# Patient Record
Sex: Male | Born: 1945 | Race: White | Hispanic: No | Marital: Married | Smoking: Current some day smoker
Health system: Southern US, Community
[De-identification: ages and names within clinical notes are randomized; demographics above are authoritative.]

## PROBLEM LIST (undated history)

## (undated) DIAGNOSIS — G4733 Obstructive sleep apnea (adult) (pediatric): Secondary | ICD-10-CM

## (undated) DIAGNOSIS — G20 Parkinson's disease: Secondary | ICD-10-CM

## (undated) DIAGNOSIS — Z7409 Other reduced mobility: Secondary | ICD-10-CM

## (undated) DIAGNOSIS — E785 Hyperlipidemia, unspecified: Secondary | ICD-10-CM

## (undated) DIAGNOSIS — R1310 Dysphagia, unspecified: Secondary | ICD-10-CM

## (undated) DIAGNOSIS — G903 Multi-system degeneration of the autonomic nervous system: Secondary | ICD-10-CM

## (undated) DIAGNOSIS — Z789 Other specified health status: Secondary | ICD-10-CM

## (undated) HISTORY — DX: Multi-system degeneration of the autonomic nervous system: G90.3

## (undated) HISTORY — DX: Dysphagia, unspecified: R13.10

## (undated) HISTORY — PX: APPENDECTOMY: SHX54

## (undated) HISTORY — PX: TRANSURETHRAL RESECTION OF PROSTATE: SHX73

## (undated) HISTORY — PX: CATARACT EXTRACTION: SUR2

## (undated) HISTORY — DX: Other specified health status: Z78.9

## (undated) HISTORY — DX: Hyperlipidemia, unspecified: E78.5

## (undated) HISTORY — PX: TONSILLECTOMY: SUR1361

## (undated) HISTORY — DX: Other reduced mobility: Z74.09

## (undated) HISTORY — PX: BELPHAROPTOSIS REPAIR: SHX369

---
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
## 2009-10-23 DIAGNOSIS — H526 Other disorders of refraction: Secondary | ICD-10-CM | POA: Insufficient documentation

## 2013-11-08 DIAGNOSIS — H5111 Convergence insufficiency: Secondary | ICD-10-CM | POA: Insufficient documentation

## 2016-02-15 DIAGNOSIS — I10 Essential (primary) hypertension: Secondary | ICD-10-CM

## 2016-02-15 DIAGNOSIS — N138 Other obstructive and reflux uropathy: Secondary | ICD-10-CM

## 2016-02-15 DIAGNOSIS — N401 Enlarged prostate with lower urinary tract symptoms: Secondary | ICD-10-CM

## 2016-02-15 DIAGNOSIS — K5900 Constipation, unspecified: Secondary | ICD-10-CM | POA: Insufficient documentation

## 2016-02-15 DIAGNOSIS — N529 Male erectile dysfunction, unspecified: Secondary | ICD-10-CM | POA: Insufficient documentation

## 2016-02-15 DIAGNOSIS — E785 Hyperlipidemia, unspecified: Secondary | ICD-10-CM | POA: Insufficient documentation

## 2016-02-15 HISTORY — DX: Other obstructive and reflux uropathy: N13.8

## 2016-02-15 HISTORY — DX: Essential (primary) hypertension: I10

## 2016-02-15 HISTORY — DX: Other obstructive and reflux uropathy: N40.1

## 2016-09-01 DIAGNOSIS — G4733 Obstructive sleep apnea (adult) (pediatric): Secondary | ICD-10-CM | POA: Insufficient documentation

## 2016-09-01 HISTORY — DX: Obstructive sleep apnea (adult) (pediatric): G47.33

## 2017-06-16 DIAGNOSIS — G3183 Dementia with Lewy bodies: Secondary | ICD-10-CM

## 2017-06-16 DIAGNOSIS — G20 Parkinson's disease: Secondary | ICD-10-CM

## 2017-06-16 DIAGNOSIS — F0280 Dementia in other diseases classified elsewhere without behavioral disturbance: Secondary | ICD-10-CM | POA: Insufficient documentation

## 2017-06-16 HISTORY — DX: Dementia in other diseases classified elsewhere, unspecified severity, without behavioral disturbance, psychotic disturbance, mood disturbance, and anxiety: F02.80

## 2017-06-16 HISTORY — DX: Parkinson's disease: G20

## 2017-06-16 HISTORY — DX: Dementia with Lewy bodies: G31.83

## 2017-09-07 DIAGNOSIS — G2581 Restless legs syndrome: Secondary | ICD-10-CM | POA: Insufficient documentation

## 2017-09-07 HISTORY — DX: Restless legs syndrome: G25.81

## 2017-12-28 DIAGNOSIS — R252 Cramp and spasm: Secondary | ICD-10-CM | POA: Insufficient documentation

## 2019-07-26 ENCOUNTER — Other Ambulatory Visit: Payer: Self-pay

## 2019-07-26 ENCOUNTER — Encounter: Payer: Self-pay | Admitting: Neurology

## 2019-07-26 ENCOUNTER — Ambulatory Visit: Payer: Medicare Other | Admitting: Neurology

## 2019-07-26 VITALS — BP 116/72 | HR 94 | Temp 97.9°F | Ht 67.0 in | Wt 185.3 lb

## 2019-07-26 DIAGNOSIS — R413 Other amnesia: Secondary | ICD-10-CM | POA: Diagnosis not present

## 2019-07-26 DIAGNOSIS — G20 Parkinson's disease: Secondary | ICD-10-CM

## 2019-07-26 DIAGNOSIS — Z9181 History of falling: Secondary | ICD-10-CM

## 2019-07-26 DIAGNOSIS — K5909 Other constipation: Secondary | ICD-10-CM

## 2019-07-26 NOTE — Patient Instructions (Addendum)
You have a history of Parkinson's disease with right-sided more than left-sided symptoms and signs.  I agree with Dr. Clarisa Kindred assessment and plan for you.  I would recommend that you continue with your Sinemet IR and Sinemet CR at the scheduled times, please try to be on schedule and try not to skip any doses.I would not favor adding another medication such as the amantadine at this time.  I recommend that you continue with the entacapone as scheduled 4 times a day.  Please be more proactive about your constipation issues, I would like for you to take MiraLAX as needed and add natural fiber such as Metamucil if possible or more natural fiber and probiotics, try to hydrate well with water, 6 to 8 cups/day are recommended, 8 ounce each. I do believe you should start using a rolling walker.  Please also follow through with the physical therapy referral that Dr. Anne Hahn initiated, I believe you will benefit from physical therapy. Please follow-up with her as scheduled. Please be cautious with your alcohol intake, try to limit yourself to no more than 1 drink occasionally. As discussed, I am not sure that you are safe to drive, please also discuss with Dr. Anne Hahn at your next appointment.

## 2019-07-26 NOTE — Progress Notes (Signed)
Subjective:    Patient ID: Ryan Morales is a 74 y.o. male.  HPI    Ryan Foley, MD, PhD [facility] [address], [address] [address] [place], [place] [address]  Dear Dr. Anne Hahn,   I saw your patient, Ryan Morales, upon your kind request to my neurologic clinic today for second opinion of his parkinsonism.  The patient is accompanied by his wife today.  As you know, Ryan Morales is a 73 yo RH gentleman with an underlying medical history of hyperlipidemia, diabetes, obstructive sleep apnea, and obesity, who was diagnosed with Parkinson's disease in or around 2015.  He started off with right-sided symptoms, particularly a tremor.  He has had fine motor difficulties and balance difficulties over time, had vision difficulties and was seen by a neuro-ophthalmologist in [place] where they moved from in December 2020.  He used to see a movement disorder specialist in [place], [place].  He was started on Sinemet IR.  He has not been on a dopamine agonist as I understand.  Sinemet CR and Comtan were added afterwards and amantadine was considered but never actually added.  He has had constipation, lately, he has had more sinister constipation, takes MiraLAX only as needed but typically has a bowel movement every 4 days.  He tries to hydrate well.  Before they moved here he had a reasonably good schedule with his medication and also had established with physical therapy.  He was more active.  He has had more stress because of the move, they are still settling in and unpacking.  They moved into a single story home to be closer to his stepdaughter.  His wife has 1 daughter and 1 son, son is close to [place] and daughter works at [employer]. I reviewed your office note from 06/22/2019.  He had a recent brain MRI through your office as well as an EEG.  I reviewed the results: Brain MRI with and without contrast on 07/12/2019 showed: IMPRESSION:   1. Moderate cerebral atrophy and  mild-to-moderate leukoaraiosis.  2. Small focal area of macrocystic encephalomalacia in the posterior, superior portion of the left superior frontal gyrus.  3. 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  4. No evidence of acute intracranial abnormality. He had an EEG on 06/28/2019 and I reviewed the results:    INTERPRETATION:        -Drowsiness and sleep were  achieved.   -No clear epileptiform activity was noted and therefore no definite evidence was noted for a seizure disorder.     -The mild diffuse slowing indicates a  slight generalized neurophysiological disturbance.  The diffuse slowing can be seen in patients with toxic or metabolic disorders, neurodegenerative disorders, or recent seizure.       He is currently on Sinemet IR 1 pill 4 times a day at 7, 1, 4 PM and 7 PM, he takes Sinemet ER twice daily at 10 AM and bedtime.  He takes the entacapone 200 mg 4 times a day with the Sinemet IR.  Sometimes when he forgets a dose he does not really feel a difference.   He has had memory loss, he is currently on Exelon patch, 9.5 mg daily. He has not driven in about a year.  His wife is not fully comfortable with him driving any longer and getting a [employer].  He reports that he volitionally gave up driving as he was not as comfortable.  He would be okay no longer to  drive.  He does drink alcohol on a regular basis, maybe 5 days out of the week, up to 2 drinks each time. He has been referred to physical therapy but has not heard back yet.  He was also advised to start using a walker, he has not gotten a walker yet.  He has fallen.  He was encouraged to use a walker at all times.  His Past Medical History Is Significant For: Past Medical History:  Diagnosis Date  . OSA (obstructive sleep apnea)   . Parkinson disease (HCC)     His Past Surgical History Is Significant For: Past Surgical History:  Procedure Laterality Date  . APPENDECTOMY    . BELPHAROPTOSIS  REPAIR Bilateral   . CATARACT EXTRACTION Bilateral   . TONSILLECTOMY    . TRANSURETHRAL RESECTION OF PROSTATE      His Family History Is Significant For: Family History  Problem Relation Age of Onset  . Diabetes Mother   . Congestive Heart Failure Mother   . High Cholesterol Father   . Prostate cancer Father   . Stroke Father     His Social History Is Significant For: Social History   Socioeconomic History  . Marital status: Married    Spouse name: Not on file  . Number of children: Not on file  . Years of education: Not on file  . Highest education level: Not on file  Occupational History  . Not on file  Tobacco Use  . Smoking status: Current Some Day Smoker    Types: Cigars  . Smokeless tobacco: Never Used  Substance and Sexual Activity  . Alcohol use: Yes    Comment: 6-10 drinks per week brandy, scotch   . Drug use: Never  . Sexual activity: Not on file  Other Topics Concern  . Not on file  Social History Narrative  . Not on file   Social Determinants of Health   Financial Resource Strain:   . Difficulty of Paying Living Expenses: Not on file  Food Insecurity:   . Worried About Programme researcher, broadcasting/film/video in the Last Year: Not on file  . Ran Out of Food in the Last Year: Not on file  Transportation Needs:   . Lack of Transportation (Medical): Not on file  . Lack of Transportation (Non-Medical): Not on file  Physical Activity:   . Days of Exercise per Week: Not on file  . Minutes of Exercise per Session: Not on file  Stress:   . Feeling of Stress : Not on file  Social Connections:   . Frequency of Communication with Friends and Family: Not on file  . Frequency of Social Gatherings with Friends and Family: Not on file  . Attends Religious Services: Not on file  . Active Member of Clubs or Organizations: Not on file  . Attends Banker Meetings: Not on file  . Marital Status: Not on file    His Allergies Are:  Not on File:   Her Current  Medications Are:  Outpatient Encounter Medications as of 07/26/2019  Medication Sig  . atorvastatin (LIPITOR) 40 MG tablet Take 40 mg by mouth daily.  Marland Kitchen BABY ASPIRIN PO Take by mouth.  . carbidopa-levodopa (SINEMET IR) 25-100 MG tablet Take 1 tablet by mouth 4 (four) times daily.  . Carbidopa-Levodopa ER (SINEMET CR) 25-100 MG tablet controlled release Take 1 tablet by mouth 2 (two) times daily.  . entacapone (COMTAN) 200 MG tablet Take 200 mg by mouth 4 (four) times daily.  Marland Kitchen  Famotidine 20 MG CHEW Chew by mouth. BID PRN  . finasteride (PROSCAR) 5 MG tablet Take 5 mg by mouth daily.  Marland Kitchen gabapentin (NEURONTIN) 300 MG capsule Take 300 mg by mouth. 1 in the am 2 in the evening  . ketoconazole (NIZORAL) 2 % shampoo Apply 1 application topically 2 (two) times a week.  Marland Kitchen lisinopril (ZESTRIL) 5 MG tablet Take 5 mg by mouth daily.  Marland Kitchen MELATONIN PO Take by mouth.  . metFORMIN (GLUCOPHAGE) 1000 MG tablet Take 1,000 mg by mouth 2 (two) times daily with a meal.  . mometasone (ELOCON) 0.1 % cream Apply 1 application topically daily.  . rivastigmine (EXELON) 9.5 mg/24hr Place 9.5 mg onto the skin daily.   No facility-administered encounter medications on file as of 07/26/2019.  :   Review of Systems:  Out of a complete 14 point review of systems, all are reviewed and negative with the exception of these symptoms as listed below:  Review of Systems  Neurological:       Reports he is here for evaluation on parkinson's.     Objective:  Neurological Exam  Physical Exam Physical Examination:   Vitals:   07/26/19 1615  BP: 116/72  Pulse: 94  Temp: 97.9 F (36.6 C)    General Examination: The patient is a very pleasant 74 y.o. male in no acute distress. He appears well-developed and well-nourished and well groomed.   HEENT: Normocephalic, atraumatic, pupils are equal, round and reactive to light and accommodation. He wears corrective eyeglasses, extraocular tracking is mildly Impaired.  Hearing is  grossly intact, face is symmetric with moderate facial masking, he has moderate nuchal rigidity, no obvious lip, neck or jaw tremor.  Speech is mildly hypophonic, no obvious dysarthria noted.  He has no orofacial dyskinesias, airway examination reveals mild mouth dryness, no obvious sialorrhea, tongue protrudes centrally and palate elevates symmetrically.  Chest: Clear to auscultation without wheezing, rhonchi or crackles noted.  Heart: S1+S2+0, regular and normal without murmurs, rubs or gallops noted.   Abdomen: Soft, non-tender and non-distended with normal bowel sounds appreciated on auscultation.  Extremities: There is no pitting edema in the distal lower extremities bilaterally.   Skin: Warm and dry without trophic changes noted.  Musculoskeletal: exam reveals no obvious joint deformities, tenderness or joint swelling or erythema.   Neurologically:  Mental status: The patient is awake, alert and oriented in all 4 spheres. His immediate and remote memory, attention, language skills and fund of knowledge are Mildly impaired, he has difficulty with details and timelines in his history, his wife adds more details to the history. Mood is normal and affect is normal.  Cranial nerves II - XII are as described above under HEENT exam. In addition: shoulder shrug is normal with equal But left shoulder is slightly higher than right.  Motor examination reveals normal bulk and strength for age, he does have an increased tone throughout, right side with some cogwheeling noted.  He has no obvious resting tremor.  Romberg is not tested for safety, reflexes are about 1+ throughout, absent in the ankles.  Fine motor skills and coordination: He has moderate difficulty with finger taps, hand movements and rapid alternating patting in the right upper extremity, slightly better on the left.  Foot taps and foot agility mildly impaired bilaterally.  No obvious lateralization noted.  He stands with difficulty and has  to push himself up, he has a moderately stooped posture. He did not bring a cane or walker, he has a  decreased stride length and decreased pace, decreased arm swing noted on the right more than left, he turns in 3-4 steps, slight insecurity with turns are noted.Balance is mildly impaired, he denies any orthostatic lightheadedness. Cerebellar testing shows no dysmetria or intention tremor, sensory exam is intact to light touch throughout.  Assessment and Plan:  In summary, Ryan Morales is a very pleasant 74 y.o.-year old male with an underlying medical history of hyperlipidemia, diabetes, obstructive sleep apnea, and obesity, who Presents for second opinion of his Parkinson's disease which has been treated with levodopa therapy and entacapone.  He has had associated constipation, balance problems and falls, and associated memory loss.His history and examination are in keeping with right-sided predominant Parkinson's disease.  He does not have a telltale tremor currently.  I had a long discussion with the patient and his wife.  He has had some flareup in his stress and anxiety what with their recent move from [place].He is encouraged to stay better hydrated with water, and be more proactive and consistent with his constipation regimen.  He is furthermore cautioned regarding his regular alcohol intake and advised to limit himself to no more than 1 drink on occasion.  We talked about his driving.  I discouraged him from driving at this time.  He is advised to start using a walker as previously advised.  He is waiting to hear back from physical therapy, I Believe he would benefit from physical therapy.  He is encouraged to be more active on a regular basis, in the form of walking if possible. I do believe his medication regimen is appropriate.  He had a recent EEG and a recent brain MRI which I reviewed. He is advised to follow-up with you as scheduled.  I would not favor adding amantadine which was previously  considered by his previous neurologist in [place]. I answered all their questions today and they were in agreement with the plan. Thank you very much for letting me weigh in.  Sincerely,   Ryan Foley, MD, PhD

## 2019-08-08 DIAGNOSIS — M21961 Unspecified acquired deformity of right lower leg: Secondary | ICD-10-CM | POA: Insufficient documentation

## 2019-08-08 DIAGNOSIS — E119 Type 2 diabetes mellitus without complications: Secondary | ICD-10-CM | POA: Insufficient documentation

## 2019-08-08 HISTORY — DX: Type 2 diabetes mellitus without complications: E11.9

## 2019-10-04 DIAGNOSIS — H02834 Dermatochalasis of left upper eyelid: Secondary | ICD-10-CM

## 2019-10-04 DIAGNOSIS — H53022 Refractive amblyopia, left eye: Secondary | ICD-10-CM | POA: Insufficient documentation

## 2019-10-04 DIAGNOSIS — H524 Presbyopia: Secondary | ICD-10-CM

## 2019-10-04 DIAGNOSIS — H02831 Dermatochalasis of right upper eyelid: Secondary | ICD-10-CM

## 2019-10-04 DIAGNOSIS — Z961 Presence of intraocular lens: Secondary | ICD-10-CM

## 2019-10-04 DIAGNOSIS — H16223 Keratoconjunctivitis sicca, not specified as Sjogren's, bilateral: Secondary | ICD-10-CM

## 2019-10-04 DIAGNOSIS — H43393 Other vitreous opacities, bilateral: Secondary | ICD-10-CM | POA: Insufficient documentation

## 2019-10-04 HISTORY — DX: Presbyopia: H52.4

## 2019-10-04 HISTORY — DX: Keratoconjunctivitis sicca, not specified as Sjogren's, bilateral: H16.223

## 2019-10-04 HISTORY — DX: Dermatochalasis of left upper eyelid: H02.834

## 2019-10-04 HISTORY — DX: Presence of intraocular lens: Z96.1

## 2019-10-04 HISTORY — DX: Dermatochalasis of right upper eyelid: H02.831

## 2020-03-05 ENCOUNTER — Encounter: Payer: Self-pay | Admitting: Gastroenterology

## 2020-03-09 NOTE — Progress Notes (Signed)
Assessment/Plan:   1.  Parkinsons Disease, sx's since 2015, dx 2017  -take carbidopa/levodopa 25/100, 2 tablets at 10am/2pm/6pm (timing of that was changed today)  -add carbidopa/levodopa 50/200 CR at bed for cramping at night  -d/c entacapone due to memory change  -information to [facility] encouraged.  Already doing home physical therapy.  2.  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  3.  Sialorrhea  -This is commonly associated with PD.  We talked about treatments.  The patient is not a candidate for oral anticholinergic therapy because of increased risk of confusion and falls.  We discussed Botox (type A and B) and 1% atropine drops.  We discusssed that candy like lemon drops can help by stimulating mm of the oropharynx to induce swallowing.  He will think about that.  4.  Alcohol  -discussed importance of decreasing alcohol to 1 day/week and potentially just stopping alcohol.  While only drinking one drink per day (bourbon), it will lead to more falls and discussed that need to use liver for metabolism of Parkinsons Disease meds.    5.  GAD  -discussed counseling.  We will send a referral to [facility].  6.  Neurogenic Orthostatic Hypotension  -Takes midodrine, 10 mg in the morning and another 10 if needed at lunch.  7.  Pt to f/u here or with Dr. Anne Hahn, whichever is more convenient  Subjective:   Ryan Morales was seen today in the movement disorders clinic for neurologic consultation at the request of Delories Heinz., MD.  The consultation is for the evaluation of Parkinsons Disease.  Patient has seen multiple prior neurologists, including one in [place] ( at [facility]), Dr. Stacy Gardner, and Dr. Frances Furbish.  Records from each of these neurologists is reviewed.  Patient apparently diagnosed in approximately 2017 with Parkinson's  disease, after presenting with stiffness on the right arm and shuffling.  Symptoms started in approximately 2015.  Patient placed on levodopa at diagnosis.  Concerns about memory came up at the end of 2020.  Patient moved to [facility] in November, 2020 and began to see Dr. Stacy Gardner in January, 2021.  Patient consulted with Dr. Frances Furbish in February, 2021.  Physical therapy was recommended.  Midodrine was added in May, 2021 for orthostasis.  Current movement disorder medications:  Carbidopa/levodopa 25/100, 2 tablets 3 times per day at 10am/4pm/10pm (was prior 1 tablet at 7 AM/11 AM/4 PM/7 PM and carbidopa/levodopa 25/100 CR at 7 AM at bedtime) Entacapone 200 mg, 1 tablet 3 times per day Gabapentin, 300 mg, 2 at bedtime Midodrine, 10 mg, 1 in the morning and 1 after lunch (they take the lunch one about 1 time per week, depending on BP) Exelon patch, 13.3 mg daily  Specific Symptoms:  Tremor: No. Family hx of similar:  No. Voice: weaker - last voice therapy few years ago when in [place] Sleep: trouble staying asleep due to RR  Vivid Dreams:  No.  Acting out dreams: rarely Wet Pillows: No. Postural symptoms:  Yes.    Falls?  Yes.    - last fall about 1 week ago - "legs froze."  No fx from falls.  Falls about 1 time per week.  Uses walker about 50% of the time.  In home PT now Bradykinesia symptoms: shuffling gait, drooling while awake, difficulty getting out of a chair and difficulty  regaining balance Loss of smell:  No. Loss of taste:  No. Urinary Incontinence:  Yes.   Difficulty Swallowing:  occasionally Handwriting, micrographia: No. Trouble with ADL's:  Yes.   - needs help with pants  Trouble buttoning clothing: No. Depression:  No. but wife states that he is anxious Memory changes:  Yes.  , he is repetitive.  No longer drives since just prior to moving to [place].  Wife puts meds in pill box and wife may give them to him.   Hallucinations:  No.  visual distortions: Yes.   N/V:   No. Lightheaded:  Yes.    Syncope: Yes.   , Possibly in January, 2021.  His wife witnessed the event.  He fell and said "do not" and I cannot" when she tried to get him up.  She got him to the walker and came back to find him lying flat with his eyes open but was not responding.  He was incontinent and he has no memory of the event.  Patient does have history of orthostatic hypotension.  Patient was worked up by cardiology in January, 2021.  Cardiology records indicate that there was no loss of consciousness with the event.  It was ultimately suspected that this was orthostatic in nature.  48-hour Holter was completed as well as echo that were unremarkable. Diplopia:  No. Dyskinesia:  No. Prior exposure to reglan/antipsychotics: No.   PREVIOUS MEDICATIONS: Sinemet, Sinemet CR and Comtan   ALLERGIES:   Allergies  Allergen Reactions  . Bactrim [Sulfamethoxazole-Trimethoprim] Hives    CURRENT MEDICATIONS:  Current Outpatient Medications  Medication Instructions  . atorvastatin (LIPITOR) 40 mg, Oral, Daily  . BABY ASPIRIN PO 1 tablet, Oral, Daily  . carbidopa-levodopa (SINEMET IR) 25-100 MG tablet 2 tablets, Oral, 3 times daily  . entacapone (COMTAN) 200 mg, Oral, 3 times daily  . Famotidine 20 MG CHEW Oral, BID PRN   . finasteride (PROSCAR) 5 mg, Oral, Daily  . gabapentin (NEURONTIN) 600 mg, Oral, Daily at bedtime  . ketoconazole (NIZORAL) 2 % shampoo 1 application, Topical, 2 times weekly  . lisinopril (ZESTRIL) 5 mg, Oral, Daily  . MELATONIN PO 6 mg, Oral, Daily at bedtime  . metFORMIN (GLUCOPHAGE) 1,000 mg, Oral, 2 times daily with meals  . mometasone (ELOCON) 0.1 % cream 1 application, Topical, Daily  . rivastigmine (EXELON) 9.5 mg, Transdermal, Daily    Objective:   VITALS:   Vitals:   03/13/20 1007  BP: 123/60  Pulse: 90  SpO2: 97%  Weight: 175 lb (79.4 kg)  Height: 5\' 8"  (1.727 m)    GEN:  The patient appears stated age and is in NAD. HEENT:  Normocephalic,  atraumatic.  The mucous membranes are moist. The superficial temporal arteries are without ropiness or tenderness. CV:  RRR Lungs:  CTAB Neck/HEME:  There are no carotid bruits bilaterally.  Neurological examination:  Orientation: The patient is alert and oriented x3.  Cranial nerves: There is good facial symmetry. There is facial hypomimia.  Extraocular muscles are intact. The visual fields are full to confrontational testing. The speech is fluent and clear. Soft palate rises symmetrically and there is no tongue deviation. Hearing is intact to conversational tone. Sensation: Sensation is intact to light and pinprick throughout (facial, trunk, extremities). Vibration is absent at the bilateral big toe and overall decreased distally. There is no extinction with double simultaneous stimulation. There is no sensory dermatomal level identified. Motor: Strength is 5/5 in the bilateral upper and lower extremities.  Shoulder shrug is equal and symmetric.  There is no pronator drift. Deep tendon reflexes: Deep tendon reflexes are 2/4 at the bilateral biceps, triceps, brachioradialis, 0/4 at the bilateral patella and achilles. Plantar responses are downgoing bilaterally.  Movement examination: Tone: There is mild to mod increased ton in the RUE/RLE Abnormal movements: none Coordination:  There is  decremation with RAM's, with any form of RAMS, including alternating supination and pronation of the forearm, hand opening and closing, finger taps, heel taps and toe taps, R>L in the UE but L>R in the LE Gait and Station: The patient has  difficulty arising out of a deep-seated chair without the use of the hands. The patient's stride length is very decreased and he festinates on his walker (traditional 2 wheel).  He does better on the Ustep with laser.   I have reviewed and interpreted the following labs independently Patient had lab work on 02/12/2020. Sodium was 138, potassium 3.3, chloride 103, CO2 28, BUN  15, creatinine 0.87, glucose 135. White blood cell 6.9, hemoglobin 11.9, hematocrit 34.8 and platelets 181. Last TSH was 02/24/2019 and was 2.02  Total time spent on today's visit was 90 minutes (60 minutes in face-to-face, 30 minutes reviewing records as above), including both face-to-face time and nonface-to-face time.  Time included that spent on review of records (prior notes available to me/labs/imaging if pertinent), discussing treatment and goals, answering patient's questions and coordinating care.  Cc:  [name], MD

## 2020-03-13 ENCOUNTER — Ambulatory Visit (INDEPENDENT_AMBULATORY_CARE_PROVIDER_SITE_OTHER): Payer: Medicare Other | Admitting: Neurology

## 2020-03-13 ENCOUNTER — Encounter: Payer: Self-pay | Admitting: Neurology

## 2020-03-13 ENCOUNTER — Other Ambulatory Visit: Payer: Self-pay

## 2020-03-13 VITALS — BP 123/60 | HR 90 | Ht 68.0 in | Wt 175.0 lb

## 2020-03-13 DIAGNOSIS — G903 Multi-system degeneration of the autonomic nervous system: Secondary | ICD-10-CM

## 2020-03-13 DIAGNOSIS — R413 Other amnesia: Secondary | ICD-10-CM | POA: Diagnosis not present

## 2020-03-13 DIAGNOSIS — K117 Disturbances of salivary secretion: Secondary | ICD-10-CM

## 2020-03-13 DIAGNOSIS — G20 Parkinson's disease: Secondary | ICD-10-CM

## 2020-03-13 DIAGNOSIS — F411 Generalized anxiety disorder: Secondary | ICD-10-CM | POA: Diagnosis not present

## 2020-03-13 MED ORDER — CARBIDOPA-LEVODOPA ER 50-200 MG PO TBCR: 1.0000 | EXTENDED_RELEASE_TABLET | Freq: Every day | ORAL | 1 refills | Status: DC

## 2020-03-13 NOTE — Patient Instructions (Addendum)
1.  take carbidopa/levodopa 25/100, 2 tablets at 10am/2pm/6pm 2.  add carbidopa/levodopa 50/200 CR at bed 3.  Stop entacapone  You have been referred for a neurocognitive evaluation in our office.   The evaluation has two parts.   . The first part of the evaluation is a clinical interview with the neuropsychologist (Dr. Milbert Coulter or Dr. Roseanne Reno). Please bring someone with you to this appointment if possible, as it is helpful for the doctor to hear from both you and another adult who knows you well.   . The second part of the evaluation is testing with the doctor's technician Annabelle Harman or Selena Batten). The testing includes a variety of tasks- mostly question-and-answer, some paper-and-pencil. There is nothing you need to do to prepare for this appointment, but having a good night's sleep prior to the testing, taking medications as you normally would, and bringing eyeglasses and hearing aids (if you wear them), is advised. Please make sure that you wear a mask to the appointment.  Please note: We have to reserve several hours of the neuropsychologist's time and the psychometrician's time for your evaluation appointment. As such, please note that there is a No-Show fee of $100. If you are unable to attend any of your appointments, please contact our office as soon as possible to reschedule.

## 2020-03-15 ENCOUNTER — Telehealth: Payer: Self-pay

## 2020-03-15 DIAGNOSIS — G20 Parkinson's disease: Secondary | ICD-10-CM

## 2020-03-15 MED ORDER — NONFORMULARY OR COMPOUNDED ITEM: 0 refills | Status: AC

## 2020-03-15 NOTE — Telephone Encounter (Signed)
I can write a RX if that is what he is asking but he will need to work with his insurance about that.  We have the brochures and he can work with the company directly.

## 2020-03-15 NOTE — Telephone Encounter (Signed)
Spoke with patients wife and informed her that we can give the patient a rx and a brochure. They will have to deal with the company and their insurance for approval.   She requested we mail the rx and brochure.  Items mailed to patient.

## 2020-03-15 NOTE — Telephone Encounter (Signed)
The patient was seen in the office on Tuesday and he wanted to know what we were going to do about the u-step walker. Were we going to try to get it approved by his insurance or what was the next step.   Informed patient that I would ask you and get back to him.

## 2020-04-02 ENCOUNTER — Other Ambulatory Visit: Payer: Self-pay

## 2020-04-02 ENCOUNTER — Ambulatory Visit (INDEPENDENT_AMBULATORY_CARE_PROVIDER_SITE_OTHER): Payer: Medicare Other | Admitting: Psychology

## 2020-04-02 ENCOUNTER — Ambulatory Visit: Payer: Medicare Other

## 2020-04-02 ENCOUNTER — Encounter: Payer: Self-pay | Admitting: Psychology

## 2020-04-02 DIAGNOSIS — G20 Parkinson's disease: Secondary | ICD-10-CM

## 2020-04-02 DIAGNOSIS — F0280 Dementia in other diseases classified elsewhere without behavioral disturbance: Secondary | ICD-10-CM | POA: Diagnosis not present

## 2020-04-02 DIAGNOSIS — G3183 Dementia with Lewy bodies: Secondary | ICD-10-CM | POA: Diagnosis not present

## 2020-04-02 DIAGNOSIS — G903 Multi-system degeneration of the autonomic nervous system: Secondary | ICD-10-CM | POA: Diagnosis not present

## 2020-04-02 DIAGNOSIS — R4189 Other symptoms and signs involving cognitive functions and awareness: Secondary | ICD-10-CM

## 2020-04-02 DIAGNOSIS — R1310 Dysphagia, unspecified: Secondary | ICD-10-CM | POA: Insufficient documentation

## 2020-04-02 DIAGNOSIS — Z7409 Other reduced mobility: Secondary | ICD-10-CM | POA: Insufficient documentation

## 2020-04-02 NOTE — Progress Notes (Signed)
NEUROPSYCHOLOGICAL EVALUATION Ryan Morales. [facility] [place] Department of Neurology  Date of Evaluation: April 02, 2020  Reason for Referral:   Ryan Morales is a 74 y.o. 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   Assessment and Plan:   Clinical Impression(s): Ryan Morales' pattern of performance is suggestive of moderate-severe frontal subcortical dysfunction evidenced by pronounced impairments in processing speed, working memory, executive functioning, and verbal fluency. Receptive language and visuospatial abilities were largely below expectation; however, not to the same extent as the aforementioned cognitive domains. Performance variability was noted across encoding/retrieval aspects of memory and visuoconstructional abilities. Performance was appropriate across basic attention and confrontation naming. Ryan Morales acknowledged reported difficulties completing instrumental activities of daily living (ADLs), especially surrounding medication and financial management. He also does not currently drive due to cognitive concerns. This, coupled with evidence for significant cognitive dysfunction described above, suggests that he continues to meet criteria for a Major Neurocognitive Disorder (formerly "dementia") at the present time.  Relative to his previous neuropsychological evaluation, performance declines were exhibited primarily across processing speed, executive functioning, receptive language, and verbal fluency. While overall variable, performance was improved across memory measures; this is especially true across visual memory. Improvements were also seen across basic attention and his copy of a complex figure. Other domains were  stable. Mood-related concerns continued to be minimal.   Regarding etiology, his pattern of pronounced frontal subcortical dysfunction with added visuospatial deficits remains consistent with his history of Parkinson's disease. While I cannot rule out the presence of Lewy body dementia, he does not display behavioral characteristics common with this illness. 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 Given improvements in memory functioning, coupled with evidence for intact recognition/consolidation scores and strong confrontation naming abilities, I do not have concerns surrounding Alzheimer's disease at the present time. Continued medical monitoring will be important moving forward.  Recommendations: Ryan Morales requested information surrounding local Parkinson's disease support groups. As such, I will mail him information surrounding these resources along with his report.   Given pronounced cognitive dysfunction especially surrounding processing speed and executive functioning, I agree with he and his family's decision surrounding driving and strongly encourage him to continue abstaining from all driving behaviors.   Should there be a progression of his current deficits over time, Ryan Morales is unlikely to regain any independent living skills lost. Therefore, it is recommended that he remain as involved as possible in all aspects of household chores, finances, and medication management, with supervision to ensure adequate performance. He will likely benefit from the establishment and maintenance of a routine in order to maximize his functional abilities over time.  It will be important for Ryan Morales to have another person with him when in situations where he may need to process information, weigh the pros and cons of different options, and make  decisions, in order to ensure that he fully understands and recalls all information to be considered.  If not already done, Ryan Morales and his family may want to discuss his wishes regarding durable power of attorney and medical decision making, so that he can have input into these choices. Additionally, they may  wish to discuss future plans for caretaking and seek out community options for in home/residential care should they become necessary.  Ryan Morales is encouraged to attend to lifestyle factors for brain health (e.g., regular physical exercise, good nutrition habits, regular participation in cognitively-stimulating activities, and general stress management techniques), which are likely to have benefits for both emotional adjustment and cognition. Optimal control of vascular risk factors (including safe cardiovascular exercise and adherence to dietary recommendations) is encouraged. Likewise, continued compliance with his CPAP machine will also be important.  When learning new information, he would benefit from information being broken up into small, manageable pieces. He may also find it helpful to articulate the material in his own words and in a context to promote encoding at the onset of a new task. This material may need to be repeated multiple times to promote encoding.  Memory can be improved using internal strategies such as rehearsal, repetition, chunking, mnemonics, association, and imagery. External strategies such as written notes in a consistently used memory journal, visual and nonverbal auditory cues such as a calendar on the refrigerator or appointments with alarm, such as on a cell phone, can also help maximize recall.    To address problems with processing speed, he may wish to consider:   -Ensuring that he is alerted when essential material or instructions are being presented   -Adjusting the speed at which new information is presented   -Allowing for more time in comprehending,  processing, and responding in conversation  To address problems with working memory and executive dysfunction, he may wish to consider:   -Avoiding external distractions when needing to concentrate   -Limiting exposure to fast paced environments with multiple sensory demands   -Writing down complicated information and using checklists   -Attempting and completing one task at a time (i.e., no multi-tasking)   -Verbalizing aloud each step of a task to maintain focus   -Taking frequent breaks during the completion of steps/tasks to avoid fatigue   -Reducing the amount of information considered at one time  Review of Records:   Mr. Cada completed a comprehensive neuropsychological evaluation Nancie Neas, Psy.D. and Curt Jews, Psy.D.) on 05/18/2018 at [facility]. At that time, Mr. Rizzo' overall presentation was said to be suggestive of Parkinson's disease dementia. The timeline reported by Mr. Gaede and his wife indicating the development of movement-related concerns, diagnosis of Parkinson's Disease, and subsequent development of cognitive difficulties was said to fit with the typical course seen in Parkinson's disease dementia. The general pattern of cognitive deficits in Parkinson's disease dementia includes executive dysfunction, including deficits in set shifting, attention, and planning, as well as impaired visuospatial functioning. Typically, less prominent memory deficits are seen, as well as relatively preserved language function. Because he reportedly demonstrated significant memory impairment in addition to difficulties with executive functioning, attention, and visuospatial abilities, Alzheimer's disease could not be definitely ruled out as contributing to his overall level of cognitive difficulties at that time.   Mr. Fishbaugh was seen by [facility] Lurena Joiner Tat, D.O.) on 03/13/2020 for follow-up of Parkinson's disease. Mr. Millspaugh was diagnosed with Parkinson's disease in  2017 after presenting with stiffness in the right arm and shuffling gait; symptoms were first noticeable in 2015. He currently takes carbidopa/levodopa 25/100, 2 tablets at 10am/2pm/6pm, as well as carbidopa/levodopa 50/200 CR at bed for nighttime cramping. Memory concerns were expressed years following the emergence of movement abnormalities. Regarding PD symptoms, he denied the experience of tremors. He does report frequent  falls (approximately once per week) and utilizes his walker around 50% of the time. He further exhibits bradykinesia, shuffling gait, difficulty getting out of a chair and regaining balance, and occasional drooling while awake. He also has a history of orthostatic hypotension. Visual hallucinations or evidence for a REM sleep disorder were not endorsed. Regarding ADLs, he reported that his wife provides assistance with medication and financial management. Ultimately, Mr. Serpa was referred for a repeat neuropsychological evaluation to characterize his cognitive abilities and to assist with diagnostic clarity and treatment planning.   EEG on 06/28/2019 did not demonstrate epileptiform activity. Brain MRI on 07/12/2019 revealed moderate cerebral atrophy and mild to moderate leukoaraiosis, a small focal area of macrocystic encephalomalacia in the posterior, superior portion of the left superior frontal gyrus, and old lacunar infarctions in the bilateral corona radiata.  Head CT on 02/10/2020 revealed mild to moderate cerebral atrophy and mild chronic microvascular ischemic changes.   Past Medical History:  Diagnosis Date  . 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 Dermatochalasis of both upper eyelids 10/04/2019  . Dysphagia   . Hyperlipidemia   . Hypertension 02/15/2016  . Impaired mobility and ADLs   . 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 10/04/2019  . 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  Obstructive sleep apnea 09/01/2016   Uses CPAP nightly  . Parkinson's disease with neurogenic orthostatic hypotension   . Presbyopia of both eyes 10/04/2019  . Pseudophakia of both eyes 10/04/2019  . RLS (restless legs syndrome) 09/07/2017  . Type 2 diabetes mellitus without complication, without long-term current use of insulin 08/08/2019    Past Surgical History:  Procedure Laterality Date  . APPENDECTOMY    . BELPHAROPTOSIS REPAIR Bilateral   . CATARACT EXTRACTION Bilateral   . TONSILLECTOMY    . TRANSURETHRAL RESECTION OF PROSTATE      Current Outpatient Medications:  .  atorvastatin (LIPITOR) 40 MG tablet, Take 40 mg by mouth daily., Disp: , Rfl:  .  BABY ASPIRIN PO, Take 1 tablet by mouth daily. , Disp: , Rfl:  .  carbidopa-levodopa (SINEMET CR) 50-200 MG tablet, Take 1 tablet by mouth at bedtime., Disp: 90 tablet, Rfl: 1 .  carbidopa-levodopa (SINEMET IR) 25-100 MG tablet, Take 2 tablets by mouth 3 (three) times daily. Take 2 tabs at 10am/2pm/6pm, Disp: , Rfl:  .  entacapone (COMTAN) 200 MG tablet, Take 200 mg by mouth 3 (three) times daily. , Disp: , Rfl:  .  Famotidine 20 MG CHEW, Chew by mouth. BID PRN, Disp: , Rfl:  .  finasteride (PROSCAR) 5 MG tablet, Take 5 mg by mouth daily., Disp: , Rfl:  .  gabapentin (NEURONTIN) 300 MG capsule, Take 600 mg by mouth at bedtime. , Disp: , Rfl:  .  ketoconazole (NIZORAL) 2 % shampoo, Apply 1 application topically 2 (two) times a week., Disp: , Rfl:  .  lisinopril (ZESTRIL) 5 MG tablet, Take 5 mg by mouth daily., Disp: , Rfl:  .  MELATONIN PO, Take 6 mg by mouth at bedtime. , Disp: , Rfl:  .  metFORMIN (GLUCOPHAGE) 1000 MG tablet, Take 1,000 mg by mouth 2 (two) times daily with a meal., Disp: , Rfl:  .  mometasone (ELOCON) 0.1 % cream, Apply 1 application topically daily., Disp: , Rfl:  .  NONFORMULARY OR COMPOUNDED ITEM, ONE DEVICE Dispense U-step 2  DX: g20, Disp: 1 each, Rfl: 0 .  rivastigmine (EXELON) 9.5  mg/24hr, Place 9.5 mg onto  the skin daily., Disp: , Rfl:   Clinical Interview:   The following information was obtained during a clinical interview with Mr. Tenny CrawRoss prior to cognitive testing.  History of Presenting Symptoms: Per his 2019 neuropsychological evaluation, Mr. Tenny CrawRoss' wife reported that he was diagnosed with Parkinson's Disease in 2017. She stated noticing parkinsonian symptoms since 2015, including a shuffling gait, muscle stiffness, arm rigidity, and a "blank face." Ongoing tremors were denied. She stated that movement-related issues began first but then stated that she noticed memory concerns sporadically which have progressively worsened. Mr. Tenny CrawRoss endorsed having a shuffling gait, slowed movement, stiffness in his legs, and cramping in his legs. He also noted some difficulty with reading and small handwriting. Parkinson's-related medications were said to be helpful at managing symptoms.   Cognitive Symptoms: Decreased short-term memory: Endorsed. Currently, he reported symptoms of generalized forgetfulness and short-term memory difficulties. When asked specific questions, he acknowledged trouble misplacing objects around his residence. He generally denied difficulties recalling the details of previous conversations or remembering the names of familiar individuals. Records from his previous 2019 evaluation suggested difficulties with forgetting previous conversations and repeating himself often. Memory difficulties were said to have mildly declined since his 2019 evaluation.  Decreased long-term memory: Denied. Decreased attention/concentration: Endorsed. Specifically, he reported occasional difficulties with sustained attention and may lose his train of thought at times. This was said to worsen with increased stress, leading to the potential for him to "mentally freeze." He generally denied trouble with increased distractibility.  Reduced processing speed: Endorsed. He also remarked that individuals have told him that  the rate in which he engages with others has noticeably diminished over the years.  Difficulties with executive functions: Endorsed. He reported occasional difficulties with complex planning/organization. He generally denied trouble with indecision or impulsivity. He also denied overt personality changes. Previous records suggested trouble with multi-tasking, as well as personality changes. Regarding the latter, his wife reported that Mr. Tenny CrawRoss used to be very outgoing and able to command a room well. However, he now exhibits a flat affect and appears unsure or anxious when in these situations.  Difficulties with emotion regulation: Denied. Difficulties with receptive language: Endorsed. He reported occasional trouble comprehending individuals who are speaking to him. He noted a "50/50" rate for attributing these difficulties to attentional lapses and/or trouble with frank comprehension.  Difficulties with word finding: Endorsed. He additionally noted occasionally having "garbled" and "nonsensical" speech. He stated that he is aware of this when it happens. During these times, he reported losing "context" and cannot recall the words he wants to say. He further reported that he does not feel as able to contribute to group discussions. Decreased visuoperceptual ability: Endorsed. Specifically, he reported some trouble with depth perception in his day-to-day life. This includes occasional instances where he will bump his walker into things in his environment while ambulating.   Trajectory of deficits: As mentioned above, cognitive deficits were said to follow movement abnormalities by several years. They have exhibited a gradual decline over time, including over the past two years since his previous evaluation.   Difficulties completing ADLs: Endorsed. His wife manages their personal finances; however, this has been ongoing since prior to his 2019 evaluation. His wife also organizes and manages his medications,  which represents a newer development. During his previous evaluation, his wife expressed being nervous in the car while Mr. Tenny CrawRoss was driving. She stated that he would sometimes "get too far over" and "doesn't always  seem to be aware." In the time since this evaluation, Mr. Ress made the decision to fully abstain from driving behaviors.   Additional Medical History: History of traumatic brain injury/concussion: Denied. 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 These were likely asymptomatic.  History of seizure activity: Denied. History of known exposure to toxins: Denied. Symptoms of chronic pain: Denied. Experience of frequent headaches/migraines: Denied. Frequent instances of dizziness/vertigo: Denied.  Sensory changes: He wears glasses with generally positive effect. He noted that his prescription was changed approximately six months previously and that there remains room for improvement. He also reported mild hearing loss, not to the extent where hearing aids are warranted. Other sensory changes/difficulties (e.g., taste or smell) were denied.  Balance/coordination difficulties: Endorsed. He described his balance as "generally good" but did acknowledge falling a few times per month on average. These falls are generally in the forward direction and resultant of him tripping over things in his environment. He does ambulate with the assistance of a walker, which is beneficial. He further noted increased balance concerns upon immediately standing up, potentially related to his history of orthostatic hypotension.  Other motor difficulties: Endorsed. Current tremulous symptoms were denied. Other Parkinson's disease-related symptoms are described above.   Sleep History: Estimated hours obtained each night: 5 hours. This was said to represent a normal amount of sleep for him "at this point in my life."  Difficulties falling asleep:  Denied. Difficulties staying asleep: Denied. Feels rested and refreshed upon awakening: "Generally, yes."   History of snoring: Endorsed. History of waking up gasping for air: Endorsed. Witnessed breath cessation while asleep: Endorsed. He has a prior history of obstructive sleep apnea and reported using his CPAP machine nightly.   History of vivid dreaming: Denied. Excessive movement while asleep: Denied. He did report some restless leg symptoms in the evenings. However, he did not describe this movement as excessive.  Instances of acting out his dreams: Denied.  Psychiatric/Behavioral Health History: Depression: Denied. He described his current mood as him being in "great spirits" and denied to his knowledge being previously diagnosed with any mental health conditions. Current or remote suicidal ideation, intent, or plan was denied.  Anxiety: Denied. Mania: Denied. Trauma History: Denied. Visual/auditory hallucinations: Denied. Delusional thoughts: Denied.  Tobacco: Denied outside of an occasional cigar.  Alcohol: He denied current alcohol consumption, highlighting that Dr. [name] previously advised him to cut out alcohol altogether. He previously consumed on average one bourbon beverage per night and denied a history of problematic alcohol abuse or dependence.  Recreational drugs: Denied. Caffeine: A few cups of coffee throughout the week, as well as an occasional soda.   Family History: Problem Relation Age of Onset  . Diabetes Mother   . Congestive Heart Failure Mother   . High Cholesterol Father   . Prostate cancer Father   . Stroke Father   . Other Sister        Brain tumor   This information was confirmed by Mr. Youtsey.  Academic/Vocational History: Highest level of educational attainment: 19 years. 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 He described himself as an average (B/C) student in academic settings, including his time spent in  graduate school. No relative weaknesses were identified. History of developmental delay: Denied. History of grade repetition: Denied. Enrollment in special education courses: Denied. However, records do suggest that he completed one year of speech therapy while in elementary school around age 20.  History of LD/ADHD: Denied.  Employment: Retired. 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   Evaluation Results:   Behavioral Observations: Mr. Mehaffey was unaccompanied, arrived to his appointment on time, and was appropriately dressed and groomed. He appeared alert and oriented. He exhibited a slowed, shuffling gait and ambulated with the assistance of a walker. However, he executed turns well and did not impact any walls or other things in his environment. He was also able to get in and out of his seat independently. Gross motor functioning appeared intact upon informal observation and no abnormal movements (e.g., tremors) were noted. His affect was generally relaxed and positive, but did range appropriately given the subject being discussed during the clinical interview or the task at hand during testing procedures. Spontaneous speech was generally fluent; however, he did exhibit a response latency at times and appeared to process information slowly. Very mild word finding difficulties were observed during the clinical interview. Thought processes were noticeably slowed but coherent, organized, and normal in content. Insight into his cognitive difficulties appeared adequate. During testing, sustained attention was appropriate. Task engagement was adequate and he persisted when challenged. He did appear to fatigue as the evaluation progressed. He exhibited trouble comprehending instructions across more complex tasks (e.g., D-KEFS Color-Word).  There were also instances where he appeared to stare off during testing and required a prompt from the psychometrist to resume task participation. Overall, Mr. Bancroft was cooperative with the clinical interview and subsequent testing procedures.   Adequacy of Effort: The validity of neuropsychological testing is limited by the extent to which the individual being tested may be assumed to have exerted adequate effort during testing. Mr. Shellhammer expressed his intention to perform to the best of his abilities and exhibited adequate task engagement and persistence. Scores across stand-alone and embedded performance validity measures were variable. However, below expectation performances are believed to be due to true cognitive dysfunction (namely surrounding processing speed) as opposed to poor engagement or attempts to perform poorly. As such, I do believe that results of the current evaluation are a valid representation of Mr. Budai' current cognitive functioning.  Test Results: Mr. Brayfield was poorly oriented at the time of the current evaluation. He incorrectly stated his address and was unable to recall his phone number. He also was unable to provide the current date, day of the week, or clinic location.   Intellectual abilities based upon educational and vocational attainment were estimated to be in the average to above average range. Premorbid abilities were estimated to be within the above average range based upon a single-word reading test.   Processing speed was exceptionally low. Basic attention was below average. More complex attention (e.g., working memory) was exceptionally low. Executive functioning was generally exceptionally low. However, he did perform well on a task assessing verbal abstract reasoning. Performance on a task assessing safety and judgment was well below average, largely due to the provision of vague responses void of necessary/specific details to warrant partial or full  credit.  Assessed receptive language abilities were exceptionally low. He exhibited difficulty with sequencing, understanding complex sentence structure, and following multi-step commands. Assessed expressive language was somewhat variable. Phonemic and semantic fluency were exceptionally low while confrontation naming was above average.     Assessed visuospatial/visuoconstructional abilities were variable but generally below expectation. Visual discrimination was exceptionally  low. On his drawing of a clock, he drew an incomplete outer circle, placed the number 12 correctly, and then lined numbers 1-9 in a vertical fashion underneath 12. He also was unable to place clock hands. His copy of a complex figure was normatively average. However, it did exhibit deficits due to poor planning and execution.    Learning (i.e., encoding) of novel verbal and visual information was variable, ranging from the well below average to above average normative ranges. Spontaneous delayed recall (i.e., retrieval) of previously learned information was also well below average to above average. Retention rates were 73% across a story learning task, 75% across a list learning task, and 117% across a shape learning task. Performance across recognition tasks was average, suggesting evidence for information consolidation.   Results of emotional screening instruments suggested that recent symptoms of generalized anxiety were in the minimal range, while symptoms of depression were within normal limits. A screening instrument assessing recent sleep quality suggested the presence of minimal sleep dysfunction.  Tables of Scores:   Note: This summary of test scores accompanies the interpretive report and should not be considered in isolation without reference to the appropriate sections in the text. Descriptors are based on appropriate normative data and may be adjusted based on clinical judgment. The terms "impaired" and "within normal  limits (WNL)" are used when a more specific level of functioning cannot be determined. Descriptors refer to the current evaluation only.        Effort Testing:    [facility]   December 2019 Current    Dot Counting Test: --- --- --- Below Expectation  NAB EVI: --- --- --- Within Expectation  D-KEFS Color Word Effort Index: --- --- --- Below Expectation        Orientation:       Raw Score Raw Score Percentile   NAB Orientation, Form 1 --- 20/29 --- ---        Cognitive Screening:             Raw Score Raw Score Percentile   SLUMS: --- 17/30 --- ---        Intellectual Functioning:             Standard Score Standard Score Percentile   Test of Premorbid Functioning: --- 117 87 Above Average        Memory:            NAB Memory Module, Form 2: Standard Score/ T Score Standard Score/ T Score Percentile   Total Memory Index 67 80 9 Below Average  List Learning        Total Trials 1-3 45 19/36 (42) 21 Below Average    List B 48 4/12 (49) 46 Average    Short Delay Free Recall 25 4/12 (33) 5 Well Below Average    Long Delay Free Recall 23 3/12 (32) 4 Well Below Average    Retention Percentage --- 75 (47) 38 Average    Recognition Discriminability --- 6 (46) 34 Average  Shape Learning        Total Trials 1-3 44 18/27 (58) 79 Above Average    Delayed Recall 19 7/9 (61) 86 Above Average    Retention Percentage --- 117 (55) 69 Average    Recognition Discriminability --- 7 (51) 54 Average  Story Learning        Immediate Recall 36 28/80 (32) 4 Well Below Average    Delayed Recall 30 11/40 (33) 5 Well Below Average    Retention  Percentage --- 73 (44) 27 Average  Daily Living Memory        Immediate Recall 31 34/51 (34) 5 Well Below Average    Delayed Recall 45 11/17 (39) 14 Below Average    Retention Percentage --- 85 (49) 46 Average    Recognition Hits --- 9/10 (54) 66 Average        Attention/Executive Function:            Trail Making Test (TMT): T Score Raw Score (T Score)  Percentile     Part A <20 292 secs.,  3 errors (15) <1 Exceptionally Low    Part B <20 DC'D @ 300 secs.,  2 errors --- Impaired         NAB Attention Module, Form 1: T Score T Score Percentile     Digits Forward 31 40 16 Below Average    Digits Backwards 33 25 1 Exceptionally Low         Scaled Score Scaled Score Percentile   WAIS-IV Similarities: --- 11 63 Average        D-KEFS Color-Word Interference Test: Raw Score Raw Score (Scaled Score) Percentile     Color Naming --- 58 secs. (1) <1 Exceptionally Low    Word Reading --- 83 secs. (1) <1 Exceptionally Low    Inhibition --- Discontinued --- Impaired    Inhibition/Switching --- Discontinued --- Impaired        NAB Executive Functions Module, Form 1: T Score T Score Percentile     Judgment --- 28 2 Exceptionally Low        Language:            Boston Diagnostic Aphasia Examination Raw Score Raw Score Percentile     Complex Ideational Material: 11/12 --- --- ---        Verbal Fluency Test: T Score Raw Score  (T Score) Percentile     Phonemic Fluency (FAS) 34 11 (18) <1 Exceptionally Low    Animal Fluency 40 8 (19) <1 Exceptionally Low         NAB Language Module, Form 2: T Score T Score Percentile     Auditory Comprehension --- 19 <1 Exceptionally Low    Naming 56 31/31 (57) 75 Above Average        Visuospatial/Visuoconstruction:       Raw Score Raw Score Percentile   Clock Drawing: --- 1/10 --- Impaired        NAB Spatial Module, Form 2: T Score T Score Percentile     Visual Discrimination 32 29 2 Exceptionally Low    Figure Drawing Copy --- 44 27 Average        Mood and Personality:       Raw Score Raw Score Percentile   Geriatric Depression Scale: --- 6 --- Within Normal Limits  Geriatric Anxiety Scale: --- 1 --- Minimal        Additional Questionnaires:       Raw Score Raw Score Percentile   PROMIS Sleep Disturbance Questionnaire: --- 20 --- None to Slight   Informed Consent and Coding/Compliance:   Mr.  Looper was provided with a verbal description of the nature and purpose of the present neuropsychological evaluation. Also reviewed were the foreseeable risks and/or discomforts and benefits of the procedure, limits of confidentiality, and mandatory reporting requirements of this provider. The patient was given the opportunity to ask questions and receive answers about the evaluation. Oral consent to participate was provided by the patient.  This evaluation was conducted by Newman Nickels, Ph.D., licensed clinical neuropsychologist. Mr. Ditullio completed a comprehensive clinical interview with Dr. Milbert Coulter, billed as one unit [phone], and 195 minutes of cognitive testing and scoring, billed as one unit [phone] and six additional units [phone]. Psychometrist Shan Levans, B.S., assisted Dr. Milbert Coulter with test administration and scoring procedures. As a separate and discrete service, Dr. Milbert Coulter spent a total of 155 minutes in interpretation and report writing billed as one unit [phone] and two units [id number].

## 2020-04-02 NOTE — Progress Notes (Signed)
   Psychometrician Note   Cognitive testing was administered to Ryan Morales by Shan Levans, B.S. (psychometrist) under the supervision of Dr. Newman Nickels, Ph.D., licensed psychologist on 04/02/2020. Ryan Morales did not appear overtly distressed by the testing session per behavioral observation or responses across self-report questionnaires. Dr. Newman Nickels, Ph.D. checked in with Ryan Morales as needed to manage any distress related to testing procedures (if applicable). Rest breaks were offered.    The battery of tests administered was selected by Dr. Newman Nickels, Ph.D. with consideration to Ryan Morales current level of functioning, the nature of his symptoms, emotional and behavioral responses during interview, level of literacy, observed level of motivation/effort, and the nature of the referral question. This battery was communicated to the psychometrist. Communication between Dr. Newman Nickels, Ph.D. and the psychometrist was ongoing throughout the evaluation and Dr. Newman Nickels, Ph.D. was immediately accessible at all times. Dr. Newman Nickels, Ph.D. provided supervision to the psychometrist on the date of this service to the extent necessary to assure the quality of all services provided.    Ryan Morales will return within approximately 1-2 weeks for an interactive feedback session with Dr. Milbert Coulter at which time his test performances, clinical impressions, and treatment recommendations will be reviewed in detail. Ryan Morales understands he can contact our office should he require our assistance before this time.  A total of 195 minutes of billable time were spent face-to-face with Ryan Morales by the psychometrist. This includes both test administration and scoring time. Billing for these services is reflected in the clinical report generated by Dr. Newman Nickels, Ph.D..  This note reflects time spent with the psychometrician and does not include test scores or any clinical interpretations  made by Dr. Milbert Coulter. The full report will follow in a separate note.

## 2020-04-04 ENCOUNTER — Other Ambulatory Visit: Payer: Self-pay

## 2020-04-04 ENCOUNTER — Ambulatory Visit (INDEPENDENT_AMBULATORY_CARE_PROVIDER_SITE_OTHER): Payer: Medicare Other | Admitting: Psychology

## 2020-04-04 DIAGNOSIS — G20 Parkinson's disease: Secondary | ICD-10-CM

## 2020-04-04 DIAGNOSIS — G3183 Dementia with Lewy bodies: Secondary | ICD-10-CM

## 2020-04-04 DIAGNOSIS — G903 Multi-system degeneration of the autonomic nervous system: Secondary | ICD-10-CM

## 2020-04-04 DIAGNOSIS — F0280 Dementia in other diseases classified elsewhere without behavioral disturbance: Secondary | ICD-10-CM

## 2020-04-04 NOTE — Progress Notes (Signed)
   Neuropsychology Feedback Session Eligha Bridegroom. [facility] [place] Department of Neurology  Reason for Referral:   Ryan Morales a 74 y.o. 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   Feedback:   Mr. Denz completed a comprehensive neuropsychological evaluation on 04/02/2020. Please refer to that encounter for the full report and recommendations. Briefly, results suggested moderate-severe frontal subcortical dysfunction evidenced by pronounced impairments in processing speed, working memory, executive functioning, and verbal fluency. Receptive language and visuospatial abilities were largely below expectation; however, not to the same extent as the aforementioned cognitive domains. Performance variability was noted across encoding/retrieval aspects of memory and visuoconstructional abilities. Relative to his previous neuropsychological evaluation, performance declines were exhibited primarily across processing speed, executive functioning, receptive language, and verbal fluency. While overall variable, performance was improved across memory measures; this is especially true across visual memory. Improvements were also seen across basic attention and his copy of a complex figure. Other domains were stable. Mood-related concerns continued to be minimal. Regarding etiology, his pattern of pronounced frontal subcortical dysfunction with added visuospatial deficits remains consistent with his history of Parkinson's disease. 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  Mr. Vacca was  accompanied by his wife during the current telephone call. They were within their residence while I was within my office. I discussed the limitations of evaluation and management by telemedicine and the availability of in person appointments. Mr. Eley expressed his understanding and agreed to proceed. Content of the current session focused on the results of his neuropsychological evaluation. Mr. Ruhland and his wife were given the opportunity to ask questions and their questions were answered. They were encouraged to reach out should additional questions arise. A copy of his report was mailed at the conclusion of the previous visit, along with information on Parkinson's disease-related support groups in the area.      21 minutes were spent conducting the current feedback session with Mr. Vizcarrondo, billed as one unit [phone].

## 2020-04-04 NOTE — Patient Instructions (Signed)
[name] requested information surrounding local Parkinson's disease support groups. As such, I will mail him information surrounding these resources along with his report.   Given pronounced cognitive dysfunction especially surrounding processing speed and executive functioning, I agree with he and his family's decision surrounding driving and strongly encourage him to continue abstaining from all driving behaviors.   Should there be a progression of his current deficits over time, [name] is unlikely to regain any independent living skills lost. Therefore, it is recommended that he remain as involved as possible in all aspects of household chores, finances, and medication management, with supervision to ensure adequate performance. He will likely benefit from the establishment and maintenance of a routine in order to maximize his functional abilities over time.  It will be important for [name] to have another person with him when in situations where he may need to process information, weigh the pros and cons of different options, and make decisions, in order to ensure that he fully understands and recalls all information to be considered.  If not already done, [name] and his family may want to discuss his wishes regarding durable power of attorney and medical decision making, so that he can have input into these choices. Additionally, they may wish to discuss future plans for caretaking and seek out community options for in home/residential care should they become necessary.  [name] is encouraged to attend to lifestyle factors for brain health (e.g., regular physical exercise, good nutrition habits, regular participation in cognitively-stimulating activities, and general stress management techniques), which are likely to have benefits for both emotional adjustment and cognition. Optimal control of vascular risk factors (including safe cardiovascular exercise and adherence to dietary  recommendations) is encouraged. Likewise, continued compliance with his CPAP machine will also be important.  When learning new information, he would benefit from information being broken up into small, manageable pieces. He may also find it helpful to articulate the material in his own words and in a context to promote encoding at the onset of a new task. This material may need to be repeated multiple times to promote encoding.  Memory can be improved using internal strategies such as rehearsal, repetition, chunking, mnemonics, association, and imagery. External strategies such as written notes in a consistently used memory journal, visual and nonverbal auditory cues such as a calendar on the refrigerator or appointments with alarm, such as on a cell phone, can also help maximize recall.    To address problems with processing speed, he may wish to consider:   -Ensuring that he is alerted when essential material or instructions are being presented   -Adjusting the speed at which new information is presented   -Allowing for more time in comprehending, processing, and responding in conversation  To address problems with working memory and executive dysfunction, he may wish to consider:   -Avoiding external distractions when needing to concentrate   -Limiting exposure to fast paced environments with multiple sensory demands   -Writing down complicated information and using checklists   -Attempting and completing one task at a time (i.e., no multi-tasking)   -Verbalizing aloud each step of a task to maintain focus   -Taking frequent breaks during the completion of steps/tasks to avoid fatigue   -Reducing the amount of information considered at one time

## 2020-04-14 ENCOUNTER — Ambulatory Visit: Payer: MEDICARE | Attending: Internal Medicine

## 2020-04-14 DIAGNOSIS — Z23 Encounter for immunization: Secondary | ICD-10-CM

## 2020-04-14 NOTE — Progress Notes (Signed)
   Covid-19 Vaccination Clinic  Name:  Ryan Morales    MRN: [id number] DOB: 03-Jan-1946  04/14/2020  Mr. Turnley was observed post Covid-19 immunization for 30 minutes based on pre-vaccination screening without incident. He was provided with Vaccine Information Sheet and instruction to access the V-Safe system.   Mr. Lindblad was instructed to call 911 with any severe reactions post vaccine: Marland Kitchen Difficulty breathing  . Swelling of face and throat  . A fast heartbeat  . A bad rash all over body  . Dizziness and weakness

## 2020-04-16 ENCOUNTER — Telehealth: Payer: Self-pay | Admitting: Neurology

## 2020-04-16 NOTE — Telephone Encounter (Signed)
Patient's wife called to report the patient is continuing to have worsening symptoms in his legs. She said, "His legs are locking up and he cannot move them. At night he's started crawling around the bedroom and cannot stop even though he wants to."

## 2020-04-16 NOTE — Telephone Encounter (Addendum)
Spoke with patients wife ands she states the patients legs are freezing. Wife states the patient was crawling on the floor and he didn't know why, patient was boucing on the edge of the bed and tried to get in and our of the bed but does not remember doing so.   Wife states she has not contacted pcp because it is parkinson's related.   Informed her that Dr Tat is out of the office until Wednesday and she can expect a response sometime Wednesday.   She voiced understanding.

## 2020-04-17 ENCOUNTER — Encounter: Payer: Self-pay | Admitting: Psychology

## 2020-04-17 NOTE — Telephone Encounter (Signed)
Spoke with wife and patient and was informed that the patient is not drinking. Wife states this has been happening since before the booster. Patient states he is taking his medication as prescribed. Wife states patients fell last week and hit his head due to his legs locking. Asked wife if they contact pcp since the patient hit his head. Wife states they watched him closely and monitored him from home. She states the patient has a rung burn on the top of his head. Wife states their has been more night time issues where the patient doesn't know where he is and things of that nature.   Informed patients wife that Dr Tat will be back in the office tomorrow and once she gets back to me I will give them a call back. She voiced understanding.

## 2020-04-17 NOTE — Telephone Encounter (Signed)
Please verify that patient is taking medications exactly as Dr. [name] prescribed: Carbidopa/levodopa 25/100mg  2 tablets at 10 AM, 2 PM and 6 PM Carbidopa/levodopa 50/200mg  CR at bedtime  Also we must verify that he is taking his medication at least an hour after eating protein-containing food.

## 2020-04-17 NOTE — Telephone Encounter (Signed)
Left message for patient to contact office.

## 2020-04-17 NOTE — Telephone Encounter (Signed)
Agree with Dr. Everlena Cooper.  Also, find out how much EtOH he is drinking.  That has been an issue previously.  He just got his covid booster 2 days ago as well which could affect things.

## 2020-04-18 NOTE — Telephone Encounter (Signed)
Left message for patients spouse to contact office .  

## 2020-04-18 NOTE — Telephone Encounter (Signed)
This is likely all part of the baseline memory change, which is often worse at night and as sun goes down.  They should use bed rails at night (can purchase cheaply off of [employer] and tuck into the mattress).  Can call pcp for lab work up to make sure that nothing like UTI increasing confusion.  Can try to start memantine, 5 mg q hs x 1 week and then 5 mg bid thereafter (for memory) in addition to the exelon he is already on.

## 2020-04-18 NOTE — Telephone Encounter (Signed)
Patient's wife left message with after hours returning phone call from the office

## 2020-04-19 MED ORDER — MEMANTINE HCL 5 MG PO TABS: ORAL_TABLET | ORAL | 0 refills | Status: DC

## 2020-04-19 NOTE — Telephone Encounter (Signed)
Left message for patients wife to contact office.  

## 2020-04-19 NOTE — Telephone Encounter (Signed)
Patient's wife called in and left a message returning Tee's call

## 2020-04-19 NOTE — Telephone Encounter (Signed)
Spoke with patients wife and she states he already has bed rails on his bed.   Wife states she would like for [place] to send the rx to the pharmacy.  Rx(s) sent to pharmacy electronically.  Advised wife to contact pcp as well to check for UTI. She voiced understanding and stated the patient had a doppler study done today.

## 2020-04-23 ENCOUNTER — Ambulatory Visit: Payer: Self-pay | Admitting: Gastroenterology

## 2020-05-07 ENCOUNTER — Telehealth: Payer: Self-pay | Admitting: Neurology

## 2020-05-07 NOTE — Telephone Encounter (Signed)
Patient's wife called to report that the Memantine is working well for patient, he is having more restful nights. Please send prescription to [facility].

## 2020-05-08 MED ORDER — MEMANTINE HCL 5 MG PO TABS: 5.0000 mg | ORAL_TABLET | Freq: Two times a day (BID) | ORAL | 1 refills | Status: DC

## 2020-05-08 NOTE — Telephone Encounter (Signed)
Left message informing wife that patients rx has been sent over to the pharmacy.   Advised a call back with any questions or concerns.

## 2020-05-08 NOTE — Telephone Encounter (Signed)
Yes, that was the intention at previous phone message few weeks ago (see message).  5 mg bid

## 2020-06-01 ENCOUNTER — Telehealth: Payer: Self-pay | Admitting: Neurology

## 2020-06-01 NOTE — Telephone Encounter (Signed)
Patients wife notified directly and voiced understanding.

## 2020-06-01 NOTE — Telephone Encounter (Signed)
Patient's wife called in stating the patient's leg freezing has gotten "really bad". The last few days his legs will just freeze up and won't move or hold him up.

## 2020-06-01 NOTE — Telephone Encounter (Signed)
Put on cx to see me.  In meantime, call pcp to be seen/have labs and make sure no uti, etc

## 2020-06-04 NOTE — Telephone Encounter (Signed)
Pt offered appt tomorrow, 06/05/20.  Wife declined appt.

## 2020-06-13 ENCOUNTER — Telehealth: Payer: Self-pay | Admitting: Neurology

## 2020-06-13 NOTE — Telephone Encounter (Signed)
Patient's wife called to report that the patient has fallen 12 out of the last 14 days. She states "his legs are frozen all the time. His labs were fine, but his cognition is declining." She is hoping Dr Tat could give her some recommendations on what to do. Patient is on waitlist. Please call

## 2020-06-14 NOTE — Telephone Encounter (Signed)
Its important that this be done as we look for sources of worsening of Parkinsons Disease symptoms

## 2020-06-14 NOTE — Telephone Encounter (Signed)
Left message asking wife if the patient had his urine checked? Advised a call back.

## 2020-06-14 NOTE — Telephone Encounter (Signed)
I see some labs from 12/23 but don't see that a UA was done.  Is that accurate or no?

## 2020-06-14 NOTE — Telephone Encounter (Signed)
Spoke with patients wife who states the patient could not give a urine while in the office. She states the patient has not went back to the office to give a specimen.

## 2020-06-14 NOTE — Telephone Encounter (Signed)
Please see note, thanks 

## 2020-06-14 NOTE — Telephone Encounter (Signed)
Advised 

## 2020-06-20 NOTE — Progress Notes (Signed)
Assessment/Plan:   1.  Parkinsons Disease, diagnosed 2017 (symptoms since 2015)  -Increase Carbidopa/levodopa 25/100, 2 tablets at 10 AM/2 tablet at 1PM/2 tablets at 4pm/1 tablet at 7pm  -did discuss that freezing is often not med responsive.  However, will check UA given that seemed to be a bit acute and urinary incontinence getting worse  -Continue carbidopa/levodopa 50/200 at bedtime.  That did help the cramping.    -in PT/OT  -awaiting U step walker  2.  Parkinson's dementia  -Had neurocognitive testing in October, 2021 with Dr. Milbert Coulter.  No driving is recommended per Dr. Milbert Coulter.  -Patient already on Exelon patch, 13.3 mg.  -may need to add memantine next visit  3.  Sialorrhea  -We will let me know if and when he is interested in Botox therapy.  4.  GAD  -Had sent referral to [facility], but they apparently were not in network with his insurance.  5.  Neurogenic Orthostatic Hypotension  -Continue midodrine, 10 mg in the morning and takes an additional at 10 mg at lunch if needed.  6.  Urinary incontinence  -will check UA but recommended that he see urology Subjective:   Ryan Morales was seen today in follow up for Parkinsons disease.  My previous records were reviewed prior to todays visit as well as outside records available to me.  Patient seen earlier than expected wife's request due to multiple falls.  Patient's wife called in mid December stating that patient had had an increase in freezing episodes.  He was offered an appointment the following day (declined appointment).  I did tell them they should follow-up with primary care just to make sure that he did not have a urinary tract infection.  He did follow-up with primary care, but patient was unable to give a urine sample.  They called back about 10 days later stating that he was having multiple falls.  I reiterated that he needed to go back and get the urine sample and he expressed understanding.  That has not yet been  completed.   Last visit, we did talk about decreasing alcohol intake and he reports today he is abstaining completely.  Only one episode of hallucination over the last few months and that was while sleeping - this was really more of a vivid dream.  Patient did have neurocognitive testing done since our last visit with Dr. Milbert Coulter, completed in October, 2021.  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  Wife does report more confusion.  Will ask what room is the bathroom.  Today, they state that he is no longer falling but is still freezing more than in the past.  Is having worsening urinary incontinence.  Used to see urology but doesn't now.  Currently going to PT/OT 2 days per week.    Current prescribed movement disorder medications: Carbidopa/levodopa 25/100, 2 tablets at 10 AM/2 PM/6 PM Carbidopa/levodopa 50/200 CR at bedtime (added last visit for cramping) Midodrine, 10 mg in the morning and another 10 if needed at lunch   PREVIOUS MEDICATIONS: Entacapone (stopped last visit due to memory change)  ALLERGIES:   Allergies  Allergen Reactions  . Bactrim [Sulfamethoxazole-Trimethoprim] Hives    CURRENT MEDICATIONS:  Outpatient Encounter Medications as of 06/21/2020  Medication Sig  . atorvastatin (LIPITOR) 40 MG tablet Take 40 mg by mouth daily.  Marland Kitchen BABY ASPIRIN PO Take 1 tablet by mouth daily.   . carbidopa-levodopa (SINEMET CR) 50-200 MG tablet Take 1 tablet  by mouth at bedtime.  . Famotidine 20 MG CHEW Chew by mouth. BID PRN  . finasteride (PROSCAR) 5 MG tablet Take 5 mg by mouth daily.  Marland Kitchen gabapentin (NEURONTIN) 300 MG capsule Take 600 mg by mouth at bedtime.   Marland Kitchen ketoconazole (NIZORAL) 2 % shampoo Apply 1 application topically 2 (two) times a week.  Marland Kitchen MELATONIN PO Take 6 mg by mouth at bedtime.   . memantine (NAMENDA) 5 MG tablet Take 1 tablet (5 mg total) by mouth 2 (two) times daily.  . metFORMIN (GLUCOPHAGE) 1000 MG tablet Take 1,000 mg by mouth 2  (two) times daily with a meal.  . mometasone (ELOCON) 0.1 % cream Apply 1 application topically daily.  . NONFORMULARY OR COMPOUNDED ITEM ONE DEVICE Dispense U-step 2  DX: g20  . rivastigmine (EXELON) 9.5 mg/24hr Place 9.5 mg onto the skin daily.  . [DISCONTINUED] carbidopa-levodopa (SINEMET IR) 25-100 MG tablet Take 2 tablets by mouth 3 (three) times daily. Take 2 tabs at 10am/2pm/6pm  . carbidopa-levodopa (SINEMET IR) 25-100 MG tablet 2 tablets at 10 AM/2 tablet at 1PM/2 tablets at 4pm/1 tablet at 7pm  . midodrine (PROAMATINE) 10 MG tablet Take 1 tablet by mouth daily. Can take additional tab if systolic under 110  . [DISCONTINUED] entacapone (COMTAN) 200 MG tablet Take 200 mg by mouth 3 (three) times daily.  (Patient not taking: Reported on 06/21/2020)  . [DISCONTINUED] lisinopril (ZESTRIL) 5 MG tablet Take 5 mg by mouth daily. (Patient not taking: Reported on 06/21/2020)   No facility-administered encounter medications on file as of 06/21/2020.    Objective:   PHYSICAL EXAMINATION:    VITALS:   Vitals:   06/21/20 1550  BP: 94/60  Pulse: 60  SpO2: 99%  Weight: 187 lb (84.8 kg)  Height: 5\' 8"  (1.727 m)    GEN:  The patient appears stated age and is in NAD. HEENT:  Normocephalic, atraumatic.   Neurological examination:  Orientation: The patient is alert and oriented x3. Cranial nerves: There is good facial symmetry with facial hypomimia. The speech is fluent and clear. Soft palate rises symmetrically and there is no tongue deviation. Hearing is intact to conversational tone. Sensation: Sensation is intact to light touch throughout Motor: Strength is at least antigravity x4.  Movement examination: Tone: There is mild increased tone in the RUE/RLE Abnormal movements: none Coordination:  There is mild decremation with RAM's, with any form of RAMS, including alternating supination and pronation of the forearm, hand opening and closing, finger taps, heel taps and toe taps, R>L but it is  improved c/t last visit Gait and Station: The patient has  difficulty arising out of a deep-seated chair without the use of the hands and requires assist OOC. The patient's stride length is very decreased and he is slow.  He is on our U step.  I have reviewed and interpreted the following labs independently Patient had lab work at primary care on December 23.  Sodium was 138, potassium 4.1, chloride 101, CO2 30, BUN 16, creatinine 0.88, glucose 153.  This was the only lab work that was done.  Total time spent on today's visit was 30 minutes, including both face-to-face time and nonface-to-face time.  Time included that spent on review of records (prior notes available to me/labs/imaging if pertinent), discussing treatment and goals, answering patient's questions and coordinating care.  Cc:  December 25., MD

## 2020-06-21 ENCOUNTER — Other Ambulatory Visit: Payer: Self-pay

## 2020-06-21 ENCOUNTER — Encounter: Payer: Self-pay | Admitting: Neurology

## 2020-06-21 ENCOUNTER — Other Ambulatory Visit (INDEPENDENT_AMBULATORY_CARE_PROVIDER_SITE_OTHER): Payer: Self-pay

## 2020-06-21 ENCOUNTER — Ambulatory Visit: Payer: Self-pay | Admitting: Neurology

## 2020-06-21 VITALS — BP 94/60 | HR 60 | Ht 68.0 in | Wt 187.0 lb

## 2020-06-21 DIAGNOSIS — G903 Multi-system degeneration of the autonomic nervous system: Secondary | ICD-10-CM

## 2020-06-21 DIAGNOSIS — G20 Parkinson's disease: Secondary | ICD-10-CM

## 2020-06-21 DIAGNOSIS — F0280 Dementia in other diseases classified elsewhere without behavioral disturbance: Secondary | ICD-10-CM

## 2020-06-21 DIAGNOSIS — N39498 Other specified urinary incontinence: Secondary | ICD-10-CM

## 2020-06-21 MED ORDER — CARBIDOPA-LEVODOPA 25-100 MG PO TABS: ORAL_TABLET | ORAL | 1 refills | Status: AC

## 2020-06-21 NOTE — Patient Instructions (Addendum)
1.  Take carbidopa/levodopa 25/100, 2 tablets at 10 AM/2 tablet at 1PM/2 tablets at 4pm/1 tablet at 7pm 2.  Continue carbidopa/levodopa 50/200 at bed 3.  Your provider has requested that you have labwork completed today. Please go to [facility] ([id number]) on the second floor of this building before leaving the office today. You do not need to check in. If you are not called within 15 minutes please check with the front desk.   The physicians and staff at [facility] are committed to providing excellent care. You may receive a survey requesting feedback about your experience at our office. We strive to receive "very good" responses to the survey questions. If you feel that your experience would prevent you from giving the office a "very good " response, please contact our office to try to remedy the situation. We may be reached at [phone]. Thank you for taking the time out of your busy day to complete the survey.

## 2020-06-22 LAB — URINALYSIS, ROUTINE W REFLEX MICROSCOPIC
Bilirubin Urine: NEGATIVE
Hgb urine dipstick: NEGATIVE
Leukocytes,Ua: NEGATIVE
Nitrite: NEGATIVE
RBC / HPF: NONE SEEN (ref 0–?)
Specific Gravity, Urine: >=1.03 — AB (ref 1.000–1.030)
Total Protein, Urine: NEGATIVE
Urine Glucose: 100 — AB
Urobilinogen, UA: 0.2 (ref 0.0–1.0)
WBC, UA: NONE SEEN (ref 0–?)
pH: 6 (ref 5.0–8.0)

## 2020-06-23 LAB — CULTURE, URINE COMPREHENSIVE
MICRO NUMBER:: 11390026
SPECIMEN QUALITY:: ADEQUATE

## 2020-08-22 ENCOUNTER — Other Ambulatory Visit: Payer: Self-pay | Admitting: Neurology

## 2020-08-22 NOTE — Telephone Encounter (Signed)
Rx(s) sent to pharmacy electronically.  

## 2020-08-28 ENCOUNTER — Telehealth: Payer: Self-pay | Admitting: Neurology

## 2020-08-28 NOTE — Telephone Encounter (Signed)
Returned call and left a message with current appointment details.

## 2020-08-28 NOTE — Telephone Encounter (Signed)
Has he seen urology as we asked them to last visit? We can put him on cx list.  If he is able to get appt remind them of being here on time/early (been showing up late last several visits and makes it difficult to see him when he is very complex)

## 2020-08-28 NOTE — Telephone Encounter (Signed)
Left message for wife to contact office.

## 2020-08-29 ENCOUNTER — Telehealth: Payer: Self-pay | Admitting: Neurology

## 2020-08-29 NOTE — Telephone Encounter (Signed)
Left message for patient or spouse to contact office.   Will address this issue in previous message.

## 2020-08-29 NOTE — Telephone Encounter (Signed)
Patient was added to wait list.

## 2020-08-29 NOTE — Telephone Encounter (Signed)
Left message for patient or spouse to contact office.  

## 2020-09-04 ENCOUNTER — Ambulatory Visit: Payer: Self-pay | Admitting: Neurology

## 2020-11-08 NOTE — Progress Notes (Signed)
Assessment/Plan:   1.  Parkinsons Disease, diagnosed 2017 (symptoms since 2015)  -Continue carbidopa/levodopa 25/100, 2 tablets at 10 AM/2 tablet at 1PM/2 tablets at 4pm/1 tablet at 7pm  -did discuss that freezing is often not med responsive.  However, will check UA given that seemed to be a bit acute and urinary incontinence getting worse  -Continue carbidopa/levodopa 50/200 at bedtime.  That did help the cramping.     -will look into hospital bed - pt spends up to 12-14 hours per day in the bed.  Cannot turn over in the bed.  Needs a trapeze and gel overlay.  High risk for skin breakdown.  Cannot get in and out of current bed  -PT/OT/social work referral for in home therapy  -may be good candidate for [facility] program  2.  Parkinson's dementia  -Had neurocognitive testing in October, 2021 with Dr. Melvyn Novas.  No driving is recommended per Dr. Melvyn Novas.  -Patient already on Exelon patch, 13.3 mg.  -? nuplazid in future but did not want to add today given adding other meds  3.  Sialorrhea  -We will let me know if and when he is interested in Botox therapy.  4.  GAD  -Had sent referral to [facility], but they apparently were not in network with his insurance.   Met with my new lcsw today and they may try to work out sessions  5.  Neurogenic Orthostatic Hypotension  -Continue midodrine, 10 mg in the morning and takes an additional at 10 mg at lunch if needed.  6.  Urinary incontinence  -Patient now following with urology and has Foley catheter.  7.  Depression  -start lexapro 10 mg daily  -may need referral to psychiatry  -meet with lcsw  8.  Dysphagia  -we will order MBE Subjective:   Ryan Morales was seen today in follow up for Parkinsons disease.  My previous records were reviewed prior to todays visit as well as outside records available to me.  Pt with wife who supplements the history.   Patient saw urology on May 16.  Foley catheter was placed. Told he was too high risk for  sx and told foley was only permanent solution.  Slightly increased his levodopa last visit, although I did tell them that oftentimes the complaints that they had about freezing was not medication responsive.  Wife complains he has had "body spasms where he jerks and flails and carries on."  Separately, she has trouble getting him in/out of bed.  Has trouble following directions.  Some hallucinations - sees things and doesn't know that they aren't real.  Not scary.  Wife thinks that he is depressed and patient thinks that he will die soon - "no reason for living."  Not suicidal per pt.  Trouble getting out of the bed.   Trouble swallowing and coughing with eating Current prescribed movement disorder medications: Carbidopa/levodopa 25/100, 2 tablets at 10 AM/2 tablet at 1PM/2 tablets at 4pm/1 tablet at 7pm (increased slightly last visit Carbidopa/levodopa 50/200 CR at bedtime (added last visit for cramping) Midodrine, 10 mg in the morning and another 10 if needed at lunch   PREVIOUS MEDICATIONS: Entacapone (stopped last visit due to memory change)  ALLERGIES:   Allergies  Allergen Reactions  . Bactrim [Sulfamethoxazole-Trimethoprim] Hives    CURRENT MEDICATIONS:  Outpatient Encounter Medications as of 11/09/2020  Medication Sig  . atorvastatin (LIPITOR) 40 MG tablet Take 40 mg by mouth daily.  . carbidopa-levodopa (SINEMET CR) 50-200 MG tablet  TAKE 1 TABLET AT BEDTIME  . carbidopa-levodopa (SINEMET IR) 25-100 MG tablet 2 tablets at 10 AM/2 tablet at 1PM/2 tablets at 4pm/1 tablet at 7pm  . Famotidine 20 MG CHEW Chew by mouth. BID PRN  . finasteride (PROSCAR) 5 MG tablet Take 5 mg by mouth daily.  Marland Kitchen gabapentin (NEURONTIN) 300 MG capsule Take 600 mg by mouth at bedtime.   Marland Kitchen ketoconazole (NIZORAL) 2 % shampoo Apply 1 application topically 2 (two) times a week.  Marland Kitchen MELATONIN PO Take 6 mg by mouth at bedtime.   . memantine (NAMENDA) 5 MG tablet Take 1 tablet (5 mg total) by mouth 2 (two) times daily.   . metFORMIN (GLUCOPHAGE) 1000 MG tablet Take 1,000 mg by mouth 2 (two) times daily with a meal.  . midodrine (PROAMATINE) 10 MG tablet Take 1 tablet by mouth daily. Can take additional tab if systolic under 725  . mometasone (ELOCON) 0.1 % cream Apply 1 application topically daily.  . NONFORMULARY OR COMPOUNDED ITEM ONE DEVICE Dispense U-step 2  DX: g20  . rivastigmine (EXELON) 9.5 mg/24hr Place 9.5 mg onto the skin daily.  Marland Kitchen BABY ASPIRIN PO Take 1 tablet by mouth daily.  (Patient not taking: Reported on 11/09/2020)   No facility-administered encounter medications on file as of 11/09/2020.    Objective:   PHYSICAL EXAMINATION:    VITALS:   Vitals:   11/09/20 1454  BP: (!) 100/58  Pulse: 85  SpO2: 98%  Weight: 170 lb (77.1 kg)  Height: 5' 7"  (1.702 m)    GEN:  The patient appears stated age and is in NAD.  Jovial and friendly HEENT:  Normocephalic, atraumatic.   Neurological examination:  Orientation: The patient is alert and oriented x3. Cranial nerves: There is good facial symmetry with facial hypomimia. The speech is fluent and clear. Soft palate rises symmetrically and there is no tongue deviation. Hearing is intact to conversational tone. Sensation: Sensation is intact to light touch throughout Motor: Strength is at least antigravity x4.  Movement examination: Tone: There is mild increased tone in the RUE/RLE Abnormal movements: none Coordination:  There is mild decremation with RAM's, with any form of RAMS, including alternating supination and pronation of the forearm, hand opening and closing, finger taps, heel taps and toe taps, R>L Gait and Station: The patient has  difficulty arising out of a deep-seated chair without the use of the hands and requires assist OOC. The patient's stride length is very decreased and he is slow.  He is on our U step.  I have reviewed and interpreted the following labs independently Patient had lab work on August 30, 2020.  Sodium was 138,  potassium 4.7, chloride 101, CO2 27, BUN 24, creatinine 1.04, glucose 214, AST 11, ALT 6, white blood cells 11.3, hemoglobin 12.7, hematocrit 37.8 and platelets 260.  Total time spent on today's visit was 40 minutes, including both face-to-face time and nonface-to-face time.  Time included that spent on review of records (prior notes available to me/labs/imaging if pertinent), discussing treatment and goals, answering patient's questions and coordinating care.  Cc:  Loraine Leriche., MD

## 2020-11-09 ENCOUNTER — Ambulatory Visit: Payer: Medicare (Managed Care) | Admitting: Neurology

## 2020-11-09 ENCOUNTER — Encounter: Payer: Self-pay | Admitting: Neurology

## 2020-11-09 ENCOUNTER — Other Ambulatory Visit: Payer: Self-pay

## 2020-11-09 VITALS — BP 100/58 | HR 85 | Ht 67.0 in | Wt 170.0 lb

## 2020-11-09 DIAGNOSIS — G20 Parkinson's disease: Secondary | ICD-10-CM

## 2020-11-09 DIAGNOSIS — G3183 Dementia with Lewy bodies: Secondary | ICD-10-CM

## 2020-11-09 DIAGNOSIS — F0280 Dementia in other diseases classified elsewhere without behavioral disturbance: Secondary | ICD-10-CM | POA: Diagnosis not present

## 2020-11-09 DIAGNOSIS — F331 Major depressive disorder, recurrent, moderate: Secondary | ICD-10-CM

## 2020-11-09 MED ORDER — ESCITALOPRAM OXALATE 10 MG PO TABS: 10.0000 mg | ORAL_TABLET | Freq: Every day | ORAL | 1 refills | Status: DC

## 2020-11-12 ENCOUNTER — Other Ambulatory Visit: Payer: Self-pay | Admitting: Neurology

## 2020-11-13 ENCOUNTER — Other Ambulatory Visit (HOSPITAL_COMMUNITY): Payer: Self-pay

## 2020-11-13 DIAGNOSIS — R1310 Dysphagia, unspecified: Secondary | ICD-10-CM

## 2020-11-13 NOTE — Progress Notes (Signed)
NO PA needed for Barium Swallow

## 2020-11-14 ENCOUNTER — Telehealth: Payer: Self-pay

## 2020-11-14 NOTE — Telephone Encounter (Signed)
Called and left a message for Patients wife to return call. Need to provide them with Central scheduling phone number at [phone]. Need to have them call Central scheduling to schedule patients Barium swallow.

## 2020-11-20 ENCOUNTER — Ambulatory Visit (HOSPITAL_COMMUNITY): Payer: Medicare (Managed Care)

## 2020-11-20 ENCOUNTER — Ambulatory Visit (HOSPITAL_COMMUNITY)
Admission: RE | Admit: 2020-11-20 | Discharge: 2020-11-20 | Disposition: A | Discharge status: Home / Self Care | Payer: MEDICARE | Source: Ambulatory Visit | Attending: Neurology | Admitting: Neurology

## 2020-11-20 ENCOUNTER — Other Ambulatory Visit: Payer: Self-pay

## 2020-11-20 ENCOUNTER — Telehealth (HOSPITAL_COMMUNITY): Payer: Self-pay

## 2020-11-20 DIAGNOSIS — G20 Parkinson's disease: Secondary | ICD-10-CM

## 2020-11-20 NOTE — Telephone Encounter (Signed)
Attempted to contact patient to reschedule OP MBS- left voicemail. ?

## 2020-11-21 ENCOUNTER — Inpatient Hospital Stay (HOSPITAL_COMMUNITY)
Admission: EM | Admit: 2020-11-21 | Discharge: 2020-11-29 | DRG: 698 | Disposition: A | Discharge status: Skilled Nursing Facility | Payer: Medicare (Managed Care) | Attending: Internal Medicine | Admitting: Internal Medicine

## 2020-11-21 ENCOUNTER — Other Ambulatory Visit: Payer: Self-pay

## 2020-11-21 ENCOUNTER — Emergency Department (HOSPITAL_COMMUNITY): Payer: Medicare (Managed Care)

## 2020-11-21 ENCOUNTER — Encounter (HOSPITAL_COMMUNITY): Payer: Self-pay | Admitting: Emergency Medicine

## 2020-11-21 DIAGNOSIS — E785 Hyperlipidemia, unspecified: Secondary | ICD-10-CM | POA: Diagnosis present

## 2020-11-21 DIAGNOSIS — T83511A Infection and inflammatory reaction due to indwelling urethral catheter, initial encounter: Principal | ICD-10-CM | POA: Diagnosis present

## 2020-11-21 DIAGNOSIS — I10 Essential (primary) hypertension: Secondary | ICD-10-CM | POA: Diagnosis present

## 2020-11-21 DIAGNOSIS — A419 Sepsis, unspecified organism: Secondary | ICD-10-CM | POA: Diagnosis present

## 2020-11-21 DIAGNOSIS — E872 Acidosis, unspecified: Secondary | ICD-10-CM | POA: Diagnosis present

## 2020-11-21 DIAGNOSIS — R6520 Severe sepsis without septic shock: Secondary | ICD-10-CM | POA: Diagnosis present

## 2020-11-21 DIAGNOSIS — F17290 Nicotine dependence, other tobacco product, uncomplicated: Secondary | ICD-10-CM | POA: Diagnosis present

## 2020-11-21 DIAGNOSIS — R319 Hematuria, unspecified: Secondary | ICD-10-CM | POA: Diagnosis present

## 2020-11-21 DIAGNOSIS — Z79899 Other long term (current) drug therapy: Secondary | ICD-10-CM

## 2020-11-21 DIAGNOSIS — Z823 Family history of stroke: Secondary | ICD-10-CM

## 2020-11-21 DIAGNOSIS — Z833 Family history of diabetes mellitus: Secondary | ICD-10-CM

## 2020-11-21 DIAGNOSIS — Y738 Miscellaneous gastroenterology and urology devices associated with adverse incidents, not elsewhere classified: Secondary | ICD-10-CM | POA: Diagnosis present

## 2020-11-21 DIAGNOSIS — Z881 Allergy status to other antibiotic agents status: Secondary | ICD-10-CM | POA: Diagnosis not present

## 2020-11-21 DIAGNOSIS — G9341 Metabolic encephalopathy: Secondary | ICD-10-CM | POA: Diagnosis present

## 2020-11-21 DIAGNOSIS — G4733 Obstructive sleep apnea (adult) (pediatric): Secondary | ICD-10-CM | POA: Diagnosis present

## 2020-11-21 DIAGNOSIS — I4510 Unspecified right bundle-branch block: Secondary | ICD-10-CM | POA: Diagnosis present

## 2020-11-21 DIAGNOSIS — Z83438 Family history of other disorder of lipoprotein metabolism and other lipidemia: Secondary | ICD-10-CM

## 2020-11-21 DIAGNOSIS — Z9079 Acquired absence of other genital organ(s): Secondary | ICD-10-CM

## 2020-11-21 DIAGNOSIS — G903 Multi-system degeneration of the autonomic nervous system: Secondary | ICD-10-CM | POA: Diagnosis present

## 2020-11-21 DIAGNOSIS — N390 Urinary tract infection, site not specified: Secondary | ICD-10-CM | POA: Diagnosis present

## 2020-11-21 DIAGNOSIS — E119 Type 2 diabetes mellitus without complications: Secondary | ICD-10-CM | POA: Diagnosis present

## 2020-11-21 DIAGNOSIS — R531 Weakness: Secondary | ICD-10-CM | POA: Diagnosis not present

## 2020-11-21 DIAGNOSIS — G20 Parkinson's disease: Secondary | ICD-10-CM | POA: Diagnosis present

## 2020-11-21 DIAGNOSIS — Z7984 Long term (current) use of oral hypoglycemic drugs: Secondary | ICD-10-CM

## 2020-11-21 DIAGNOSIS — F0280 Dementia in other diseases classified elsewhere without behavioral disturbance: Secondary | ICD-10-CM | POA: Diagnosis present

## 2020-11-21 DIAGNOSIS — G2581 Restless legs syndrome: Secondary | ICD-10-CM | POA: Diagnosis present

## 2020-11-21 DIAGNOSIS — Z8042 Family history of malignant neoplasm of prostate: Secondary | ICD-10-CM

## 2020-11-21 DIAGNOSIS — Z20822 Contact with and (suspected) exposure to covid-19: Secondary | ICD-10-CM | POA: Diagnosis present

## 2020-11-21 DIAGNOSIS — Z8249 Family history of ischemic heart disease and other diseases of the circulatory system: Secondary | ICD-10-CM | POA: Diagnosis not present

## 2020-11-21 DIAGNOSIS — R6521 Severe sepsis with septic shock: Secondary | ICD-10-CM | POA: Diagnosis present

## 2020-11-21 DIAGNOSIS — R338 Other retention of urine: Secondary | ICD-10-CM | POA: Diagnosis present

## 2020-11-21 DIAGNOSIS — N401 Enlarged prostate with lower urinary tract symptoms: Secondary | ICD-10-CM | POA: Diagnosis present

## 2020-11-21 LAB — URINALYSIS, ROUTINE W REFLEX MICROSCOPIC
Bilirubin Urine: NEGATIVE
Glucose, UA: 50 mg/dL — AB
Ketones, ur: 20 mg/dL — AB
Nitrite: NEGATIVE
Protein, ur: 100 mg/dL — AB
RBC / HPF: >50 RBC/hpf — ABNORMAL HIGH (ref 0–5)
Specific Gravity, Urine: 1.019 (ref 1.005–1.030)
WBC, UA: >50 WBC/hpf — ABNORMAL HIGH (ref 0–5)
pH: 5 (ref 5.0–8.0)

## 2020-11-21 LAB — BASIC METABOLIC PANEL
Anion gap: 11 (ref 5–15)
BUN: 16 mg/dL (ref 8–23)
CO2: 27 mmol/L (ref 22–32)
Calcium: 9.6 mg/dL (ref 8.9–10.3)
Chloride: 97 mmol/L — ABNORMAL LOW (ref 98–111)
Creatinine, Ser: 0.87 mg/dL (ref 0.61–1.24)
GFR, Estimated: >60 mL/min (ref >60)
Glucose, Bld: 191 mg/dL — ABNORMAL HIGH (ref 70–99)
Potassium: 4 mmol/L (ref 3.5–5.1)
Sodium: 135 mmol/L (ref 135–145)

## 2020-11-21 LAB — RESP PANEL BY RT-PCR (FLU A&B, COVID) ARPGX2
Influenza A by PCR: NEGATIVE
Influenza B by PCR: NEGATIVE
SARS Coronavirus 2 by RT PCR: NEGATIVE

## 2020-11-21 LAB — GLUCOSE, CAPILLARY: Glucose-Capillary: 120 mg/dL — ABNORMAL HIGH (ref 70–99)

## 2020-11-21 LAB — CBC
HCT: 36.6 % — ABNORMAL LOW (ref 39.0–52.0)
Hemoglobin: 12.3 g/dL — ABNORMAL LOW (ref 13.0–17.0)
MCH: 29.7 pg (ref 26.0–34.0)
MCHC: 33.6 g/dL (ref 30.0–36.0)
MCV: 88.4 fL (ref 80.0–100.0)
Platelets: 251 10*3/uL (ref 150–400)
RBC: 4.14 MIL/uL — ABNORMAL LOW (ref 4.22–5.81)
RDW: 12.9 % (ref 11.5–15.5)
WBC: 12.2 10*3/uL — ABNORMAL HIGH (ref 4.0–10.5)
nRBC: 0 % (ref 0.0–0.2)

## 2020-11-21 LAB — PROTIME-INR
INR: 1 (ref 0.8–1.2)
Prothrombin Time: 13.2 seconds (ref 11.4–15.2)

## 2020-11-21 LAB — LACTIC ACID, PLASMA
Lactic Acid, Venous: 3.9 mmol/L (ref 0.5–1.9)
Lactic Acid, Venous: 3.3 mmol/L (ref 0.5–1.9)

## 2020-11-21 LAB — APTT: aPTT: 28 seconds (ref 24–36)

## 2020-11-21 LAB — MAGNESIUM: Magnesium: 1.9 mg/dL (ref 1.7–2.4)

## 2020-11-21 MED ORDER — FINASTERIDE 5 MG PO TABS
5.0000 mg | ORAL_TABLET | Freq: Every day | ORAL | Status: DC
  Administered 2020-11-22 – 2020-11-29 (×8): 5 mg via ORAL
  Filled 2020-11-21 (×8): qty 1

## 2020-11-21 MED ORDER — LACTATED RINGERS IV SOLN: INTRAVENOUS | Status: DC

## 2020-11-21 MED ORDER — MEMANTINE HCL 5 MG PO TABS
5.0000 mg | ORAL_TABLET | Freq: Two times a day (BID) | ORAL | Status: DC
  Administered 2020-11-22 – 2020-11-29 (×15): 5 mg via ORAL
  Filled 2020-11-21 (×15): qty 1

## 2020-11-21 MED ORDER — ACETAMINOPHEN 325 MG PO TABS: 650.0000 mg | ORAL_TABLET | Freq: Four times a day (QID) | ORAL | Status: DC | PRN

## 2020-11-21 MED ORDER — SODIUM CHLORIDE 0.9 % IV SOLN
1.0000 g | Freq: Once | INTRAVENOUS | Status: AC
  Administered 2020-11-21: 1 g via INTRAVENOUS
  Filled 2020-11-21: qty 10

## 2020-11-21 MED ORDER — RIVASTIGMINE 9.5 MG/24HR TD PT24
9.5000 mg | MEDICATED_PATCH | Freq: Every day | TRANSDERMAL | Status: DC
  Administered 2020-11-23 – 2020-11-29 (×7): 9.5 mg via TRANSDERMAL
  Filled 2020-11-21 (×7): qty 1

## 2020-11-21 MED ORDER — INSULIN ASPART 100 UNIT/ML IJ SOLN
0.0000 [IU] | Freq: Four times a day (QID) | INTRAMUSCULAR | Status: DC
  Administered 2020-11-23: 3 [IU] via SUBCUTANEOUS
  Administered 2020-11-23: 1 [IU] via SUBCUTANEOUS
  Administered 2020-11-24: 3 [IU] via SUBCUTANEOUS
  Administered 2020-11-25: 2 [IU] via SUBCUTANEOUS
  Administered 2020-11-25: 1 [IU] via SUBCUTANEOUS

## 2020-11-21 MED ORDER — LACTATED RINGERS IV BOLUS
2250.0000 mL | Freq: Once | INTRAVENOUS | Status: AC
  Administered 2020-11-21: 2250 mL via INTRAVENOUS

## 2020-11-21 MED ORDER — ACETAMINOPHEN 650 MG RE SUPP: 650.0000 mg | Freq: Four times a day (QID) | RECTAL | Status: DC | PRN

## 2020-11-21 MED ORDER — CARBIDOPA-LEVODOPA 25-100 MG PO TABS: 1.0000 | ORAL_TABLET | Freq: Four times a day (QID) | ORAL | Status: DC

## 2020-11-21 MED ORDER — SODIUM CHLORIDE 0.9 % IV SOLN: 1.0000 g | Freq: Once | INTRAVENOUS | Status: DC

## 2020-11-21 MED ORDER — SODIUM CHLORIDE 0.9 % IV SOLN
1.0000 g | INTRAVENOUS | Status: DC
  Administered 2020-11-22 – 2020-11-23 (×2): 1 g via INTRAVENOUS
  Filled 2020-11-21 (×2): qty 1

## 2020-11-21 MED ORDER — MIDODRINE HCL 5 MG PO TABS
10.0000 mg | ORAL_TABLET | Freq: Every day | ORAL | Status: DC
  Administered 2020-11-23 – 2020-11-29 (×7): 10 mg via ORAL
  Filled 2020-11-21 (×7): qty 2

## 2020-11-21 NOTE — ED Provider Notes (Signed)
[facility]-EMERGENCY DEPT Provider Note   CSN: [id number] Arrival date & time: 11/21/20  1601     History Chief Complaint  Patient presents with  . Hematuria    Ryan Morales is a 75 y.o. male.  The history is provided by the patient and medical records. The history is limited by the condition of the patient. No language interpreter was used.  Hematuria      75 year old male significant history of BPH status post TURP, who has an indwelling Foley catheter, diabetes, Parkinson's disease, brought here via EMS from home with complaint of blood in the urine.  Most of history also obtained through wife who was at bedside.  For nearly a week, patient has a steady decline in his health.  States that he has had blood in his urine, more concentrated urine, decrease in appetite, increased weakness, because whenever he eats, mostly bedbound, and now too weak to do anything and wife is having a difficult time caring for him at home.  No report of fever no vomiting or diarrhea.  Patient has been fully vaccinated for COVID-19.  Family did reach out to PCP as well as urology in regards to the concern about potential ongoing urinary tract infection but states he would need a urine culture before treatment.  Due to his progressive worsening condition, patient brought here for further evaluation.  Patient also missed an appointment for a swallow study yesterday because pt's wife was unable to get him into the car to go to the appointment.    Past Medical History:  Diagnosis Date  . 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 Dermatochalasis of both upper eyelids 10/04/2019  . Dysphagia   . Hyperlipidemia   . Hypertension 02/15/2016  . Impaired mobility and ADLs   . 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 10/04/2019  . 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 Obstructive sleep apnea 09/01/2016    Uses CPAP nightly  . Parkinson's disease with neurogenic orthostatic hypotension   . Presbyopia of both eyes 10/04/2019  . Pseudophakia of both eyes 10/04/2019  . RLS (restless legs syndrome) 09/07/2017  . Type 2 diabetes mellitus without complication, without long-term current use of insulin 08/08/2019    Patient Active Problem List   Diagnosis Date Noted  . Impaired mobility and ADLs 04/02/2020  . Dysphagia   . Parkinson's disease with neurogenic orthostatic hypotension   . Dermatochalasis of both upper eyelids 10/04/2019  . 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 10/04/2019  . Presbyopia of both eyes 10/04/2019  . Refractive amblyopia, left 10/04/2019  . Pseudophakia of both eyes 10/04/2019  . Vitreous floater, bilateral 10/04/2019  . Acquired deformity of right foot 08/08/2019  . Type 2 diabetes mellitus without complication, without long-term current use of insulin 08/08/2019  . Leg cramping 12/28/2017  . RLS (restless legs syndrome) 09/07/2017  . 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 Obstructive sleep apnea 09/01/2016  . 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  . Constipation 02/15/2016  . Erectile dysfunction 02/15/2016  . Hypertension 02/15/2016  . Hyperlipidemia 02/15/2016  . 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 APPENDECTOMY    . BELPHAROPTOSIS REPAIR Bilateral   . CATARACT EXTRACTION Bilateral   . TONSILLECTOMY    . TRANSURETHRAL RESECTION OF PROSTATE  Family History  Problem Relation Age of Onset  . Diabetes Mother   . Congestive Heart Failure Mother   . High Cholesterol Father   . Prostate cancer Father   . Stroke Father   . Other Sister        Brain tumor    Social History   Tobacco Use  . Smoking status: Current Some Day Smoker    Types: Cigars  .  Smokeless tobacco: Never Used  Vaping Use  . Vaping Use: Never used  Substance Use Topics  . Alcohol use: Not Currently    Comment: 1 bourbon per day  . Drug use: Never    Home Medications Prior to Admission medications   Medication Sig Start Date End Date Taking? Authorizing Provider  atorvastatin (LIPITOR) 40 MG tablet Take 40 mg by mouth daily.    [provider]  carbidopa-levodopa (SINEMET CR) 50-200 MG tablet TAKE 1 TABLET AT BEDTIME 08/22/20   Tat, Octaviano Battyebecca S, DO  carbidopa-levodopa (SINEMET IR) 25-100 MG tablet 2 tablets at 10 AM/2 tablet at 1PM/2 tablets at 4pm/1 tablet at 7pm 06/21/20   Tat, Rebecca S, DO  escitalopram (LEXAPRO) 10 MG tablet Take 1 tablet (10 mg total) by mouth daily. 11/09/20   Tat, Octaviano Battyebecca S, DO  Famotidine 20 MG CHEW Chew by mouth. BID PRN    [provider]  finasteride (PROSCAR) 5 MG tablet Take 5 mg by mouth daily.    [provider]  gabapentin (NEURONTIN) 300 MG capsule Take 600 mg by mouth at bedtime.     [provider]  ketoconazole (NIZORAL) 2 % shampoo Apply 1 application topically 2 (two) times a week.    [provider]  MELATONIN PO Take 6 mg by mouth at bedtime.     [provider]  memantine (NAMENDA) 5 MG tablet TAKE 1 TABLET TWICE A DAY 11/14/20   Tat, Octaviano Battyebecca S, DO  metFORMIN (GLUCOPHAGE) 1000 MG tablet Take 1,000 mg by mouth 2 (two) times daily with a meal.    [provider]  midodrine (PROAMATINE) 10 MG tablet Take 1 tablet by mouth daily. Can take additional tab if systolic under 110 06/02/20   [provider]  mometasone (ELOCON) 0.1 % cream Apply 1 application topically daily.    [provider]  NONFORMULARY OR COMPOUNDED ITEM ONE DEVICE Dispense U-step 2  DX: g20 03/15/20   Tat, Octaviano Battyebecca S, DO  rivastigmine (EXELON) 9.5 mg/24hr Place 9.5 mg onto the skin daily.    [provider]    Allergies    Bactrim [sulfamethoxazole-trimethoprim]  Review of  Systems   Review of Systems  Genitourinary: Positive for hematuria.  All other systems reviewed and are negative.   Physical Exam Updated Vital Signs BP 125/71 (BP Location: Right Arm)   Pulse 88   Temp 97.9 F (36.6 C) (Oral)   Resp 16   Ht 5\' 7"  (1.702 m)   Wt 72.6 kg   SpO2 98%   BMI 25.06 kg/m   Physical Exam Vitals and nursing note reviewed.  Constitutional:      General: He is not in acute distress.    Appearance: He is well-developed.     Comments: Elderly male, appears deconditioned and ill-appearing  HENT:     Head: Atraumatic.  Eyes:     Conjunctiva/sclera: Conjunctivae normal.  Cardiovascular:     Rate and Rhythm: Normal rate and regular rhythm.     Pulses: Normal pulses.     Heart  sounds: Normal heart sounds.  Pulmonary:     Effort: Pulmonary effort is normal.     Breath sounds: Normal breath sounds.  Abdominal:     Palpations: Abdomen is soft.     Tenderness: There is no abdominal tenderness.  Genitourinary:    Comments: Indwelling Foley with dark urine noted in Foley bag Musculoskeletal:     Cervical back: Neck supple.  Skin:    Findings: No rash.  Neurological:     Mental Status: He is alert. Mental status is at baseline.  Psychiatric:        Mood and Affect: Mood normal.     ED Results / Procedures / Treatments   Labs (all labs ordered are listed, but only abnormal results are displayed) Labs Reviewed  URINALYSIS, ROUTINE W REFLEX MICROSCOPIC - Abnormal; Notable for the following components:      Result Value   Color, Urine AMBER (*)    APPearance CLOUDY (*)    Glucose, UA 50 (*)    Hgb urine dipstick MODERATE (*)    Ketones, ur 20 (*)    Protein, ur 100 (*)    Leukocytes,Ua LARGE (*)    RBC / HPF >50 (*)    WBC, UA >50 (*)    Bacteria, UA RARE (*)    All other components within normal limits  CBC - Abnormal; Notable for the following components:   WBC 12.2 (*)    RBC 4.14 (*)    Hemoglobin 12.3 (*)    HCT 36.6 (*)    All other  components within normal limits  BASIC METABOLIC PANEL - Abnormal; Notable for the following components:   Chloride 97 (*)    Glucose, Bld 191 (*)    All other components within normal limits  LACTIC ACID, PLASMA - Abnormal; Notable for the following components:   Lactic Acid, Venous 3.9 (*)    All other components within normal limits  URINE CULTURE  RESP PANEL BY RT-PCR (FLU A&B, COVID) ARPGX2  CULTURE, BLOOD (ROUTINE X 2)  CULTURE, BLOOD (ROUTINE X 2)  URINE CULTURE  LACTIC ACID, PLASMA  PROTIME-INR  APTT    EKG None  Radiology DG Chest Portable 1 View  Result Date: 11/21/2020 CLINICAL DATA:  Weakness EXAM: PORTABLE CHEST 1 VIEW COMPARISON:  None. FINDINGS: The heart size and mediastinal contours are within normal limits. No focal airspace disease. No pleural effusion or pneumothorax. No acute osseous abnormality. IMPRESSION: No evidence of acute cardiopulmonary disease. Electronically Signed   By: Caprice Renshaw   On: 11/21/2020 18:06    Procedures .Critical Care Performed by: Fayrene Helper, PA-C Authorized by: Fayrene Helper, PA-C   Critical care provider statement:    Critical care time (minutes):  40   Critical care was time spent personally by me on the following activities:  Discussions with consultants, evaluation of patient's response to treatment, examination of patient, ordering and performing treatments and interventions, ordering and review of laboratory studies, ordering and review of radiographic studies, pulse oximetry, re-evaluation of patient's condition, obtaining history from patient or surrogate and review of old charts     Medications Ordered in ED Medications  cefTRIAXone (ROCEPHIN) 1 g in sodium chloride 0.9 % 100 mL IVPB (0 g Intravenous Stopped 11/21/20 1829)  lactated ringers bolus 2,250 mL (2,250 mLs Intravenous New Bag/Given 11/21/20 1836)    ED Course  I have reviewed the triage vital signs and the nursing notes.  Pertinent labs & imaging results that  were available during  my care of the patient were reviewed by me and considered in my medical decision making (see chart for details).    MDM Rules/Calculators/A&P                          BP 136/73   Pulse 83   Temp 97.9 F (36.6 C) (Oral)   Resp 17   Ht 5\' 7"  (1.702 m)   Wt 72.6 kg   SpO2 99%   BMI 25.06 kg/m   Final Clinical Impression(s) / ED Diagnoses Final diagnoses:  Lower urinary tract infectious disease  Sepsis, due to unspecified organism, unspecified whether acute organ dysfunction present (HCC)    Rx / DC Orders ED Discharge Orders    None     4:38 PM Patient with Parkinson's, BPH, has indwelling Foley who is here with progressive worsening decline of his mental health, and increased confusion and also concern for urinary tract infection.  He is not on any antibiotic at this time.  He has had hematuria which has clear but urine appears dark.  He is globally weak.  5:40 PM Labs remarkable for mildly elevated white count of 12.2.  UA shows moderate hemoglobin and urine dipsticks along with large leukocyte esterase, greater than 50 WBCs and greater than 50 RBCs.  It is nitrite negative.  Since there is a change in his urine color as well as having increased fatigue and confusion, I will initiate Rocephin antibiotic.  Urine culture sent.  Since patient coughs after he eats, will obtain chest x-ray to rule out aspiration pneumonia.  Lactic acid is currently pending.  Care discussed with DR. Wentz.   6:25 PM Lactic acid is 3.9.  Code sepsis initiated, wound care fluid resuscitation as antibiotic and will obtain blood culture.   chest x-ray without evidence of acute cardiopulmonary disease.  6:40 PM Appreciate consultation from [facility] DR. Howerton who agrees to see and will admit pt for further care.  Sepsis reassessment done.      , PA-C 11/21/20 1842    01/21/21, MD 11/22/20 1136

## 2020-11-21 NOTE — ED Provider Notes (Signed)
  Face-to-face evaluation   History: He presents for evaluation of gradually worsening weakness, ability to walk, decreased appetite, and multiple falls.  No specific injuries.  Last night he slept on the floor because during the evening he was weak, and went to the floor and was unable to get up and his wife was unable to assist him up.  She is having trouble getting to appointments including to the urologist for evaluation of hematuria, to rehab for evaluation of swallowing trouble, and general medical care with his PCP.  He is significantly weaker over the last 1 week time.  He is taking his usual medicines as directed.  There have been no recent fever, chills, shortness of breath, neck, or leg pain.  He has some pain in his right lower back.  Several days ago his wife noticed blood in his urine possibly after a fall, and now his urine is "dark," and she has not seen blood in the last couple of days.  Physical exam: Alert, calm, responsive and answers questions accurately.  No dysarthria or aphasia.  He is able to left arms and legs off the stretcher independently.  Heart regular rate and rhythm without murmur, lungs clear anteriorly.  Abdomen soft and nontender.  Medical screening examination/treatment/procedure(s) were conducted as a shared visit with non-physician practitioner(s) and myself.  I personally evaluated the patient during the encounter    Mancel Bale, MD 11/22/20 1136

## 2020-11-21 NOTE — Progress Notes (Signed)
Brief note regarding plan, with full H&P to follow:  75 year old male with history of Parkinson's disease, chronic indwelling Foley catheter in the setting of BPH, who is admitted to [facility] for further evaluation management of suspected acute metabolic encephalopathy in the setting of urinary tract infection after presenting to [facility] long ED from home for evaluation of 1 week of progressive confusion relative to baseline mental status associate with generalized weakness.  Initial labs as conveyed by EDP felt to be consistent with urinary tract infection, and lactate elevated at 3.9.  Has received 30 mL/kg IVF bolus and Rocephin.  We will continue Rocephin as well as continuous IV fluids, closely monitor for result of repeat lactate.  Blood cultures x2, and follow for result of urine culture.  N.p.o. pending result of nursing bedside swallow screen.     Newton Pigg, DO Hospitalist

## 2020-11-21 NOTE — Telephone Encounter (Signed)
Patient's wife returned call.  She'd also like to know where the order for the hospital bed was sent to?

## 2020-11-21 NOTE — Sepsis Progress Note (Signed)
Abx were given prior to code sepsis being called.

## 2020-11-21 NOTE — ED Notes (Signed)
ED TO INPATIENT HANDOFF REPORT  Name/Age/Gender Ryan Morales 75 y.o. male  Code Status    Code Status Orders  (From admission, onward)         Start     Ordered   11/21/20 1847  Full code  Continuous        11/21/20 1846        Code Status History    This patient has a current code status but no historical code status.   Advance Care Planning Activity    Advance Directive Documentation   Flowsheet Row Most Recent Value  Type of Advance Directive Healthcare Power of Attorney, Living will  Pre-existing out of facility DNR order (yellow form or pink MOST form) --  "MOST" Form in Place? --      Home/SNF/Other Home  Chief Complaint Acute metabolic encephalopathy [G93.41]  Level of Care/Admitting Diagnosis ED Disposition    ED Disposition Condition Comment   Admit  Hospital Area: [facility] [[id number]]  Level of Care: Progressive [102]  Admit to Progressive based on following criteria: MULTISYSTEM THREATS such as stable sepsis, metabolic/electrolyte imbalance with or without encephalopathy that is responding to early treatment.  May admit patient to [facility] or [facility] if equivalent level of care is available:: No  Covid Evaluation: Asymptomatic Screening Protocol (No Symptoms)  Diagnosis: Acute metabolic encephalopathy [[id number]]  Admitting Physician: Angie Fava [[id number]]  Attending Physician: Angie Fava [[id number]]  Estimated length of stay: past midnight tomorrow  Certification:: I certify this patient will need inpatient services for at least 2 midnights       Medical History Past Medical History:  Diagnosis Date  . 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 Dermatochalasis of both upper eyelids 10/04/2019  . Dysphagia   . Hyperlipidemia   . Hypertension 02/15/2016  . Impaired mobility and ADLs   . 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 10/04/2019  . 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 Obstructive sleep apnea 09/01/2016   Uses CPAP nightly  . Parkinson's disease with neurogenic orthostatic hypotension   . Presbyopia of both eyes 10/04/2019  . Pseudophakia of both eyes 10/04/2019  . RLS (restless legs syndrome) 09/07/2017  . Type 2 diabetes mellitus without complication, without long-term current use of insulin 08/08/2019    Allergies Allergies  Allergen Reactions  . Bactrim [Sulfamethoxazole-Trimethoprim] Hives    IV Location/Drains/Wounds Patient Lines/Drains/Airways Status    Active Line/Drains/Airways    Name Placement date Placement time Site Days   Peripheral IV 11/21/20 20 G Right Antecubital 11/21/20  1620  Antecubital  less than 1   Peripheral IV 11/21/20 20 G 1" Posterior;Right Hand 11/21/20  1835  Hand  less than 1          Labs/Imaging Results for orders placed or performed during the hospital encounter of 11/21/20 (from the past 48 hour(s))  Urinalysis, Routine w reflex microscopic Urine, Bag (ped)     Status: Abnormal   Collection Time: 11/21/20  4:08 PM  Result Value Ref Range   Color, Urine AMBER (A) YELLOW    Comment: BIOCHEMICALS MAY BE AFFECTED BY COLOR   APPearance CLOUDY (A) CLEAR   Specific Gravity, Urine 1.019 1.005 - 1.030   pH 5.0 5.0 - 8.0   Glucose, UA 50 (A) NEGATIVE mg/dL   Hgb urine dipstick MODERATE (A) NEGATIVE   Bilirubin Urine NEGATIVE NEGATIVE   Ketones, ur 20 (A)  NEGATIVE mg/dL   Protein, ur 818 (A) NEGATIVE mg/dL   Nitrite NEGATIVE NEGATIVE   Leukocytes,Ua LARGE (A) NEGATIVE   RBC / HPF >50 (H) 0 - 5 RBC/hpf   WBC, UA >50 (H) 0 - 5 WBC/hpf   Bacteria, UA RARE (A) NONE SEEN   Squamous Epithelial / LPF 0-5 0 - 5   WBC Clumps PRESENT    Mucus PRESENT     Comment: Performed at [facility], [address] [place], [place] [address]  CBC     Status: Abnormal   Collection Time: 11/21/20  4:08 PM  Result Value Ref Range   WBC 12.2  (H) 4.0 - 10.5 K/uL   RBC 4.14 (L) 4.22 - 5.81 MIL/uL   Hemoglobin 12.3 (L) 13.0 - 17.0 g/dL   HCT 97.0 (L) 26.3 - 78.5 %   MCV 88.4 80.0 - 100.0 fL   MCH 29.7 26.0 - 34.0 pg   MCHC 33.6 30.0 - 36.0 g/dL   RDW 88.5 02.7 - 74.1 %   Platelets 251 150 - 400 K/uL   nRBC 0.0 0.0 - 0.2 %    Comment: Performed at [facility], [address] [place], [place] [address]  Basic metabolic panel     Status: Abnormal   Collection Time: 11/21/20  4:08 PM  Result Value Ref Range   Sodium 135 135 - 145 mmol/L   Potassium 4.0 3.5 - 5.1 mmol/L   Chloride 97 (L) 98 - 111 mmol/L   CO2 27 22 - 32 mmol/L   Glucose, Bld 191 (H) 70 - 99 mg/dL    Comment: Glucose reference range applies only to samples taken after fasting for at least 8 hours.   BUN 16 8 - 23 mg/dL   Creatinine, Ser 7.67 0.61 - 1.24 mg/dL   Calcium 9.6 8.9 - 20.9 mg/dL   GFR, Estimated >47 >09 mL/min    Comment: (NOTE) Calculated using the CKD-EPI Creatinine Equation (2021)    Anion gap 11 5 - 15    Comment: Performed at [facility], [address], [place], [place] [address]  Lactic acid, plasma     Status: Abnormal   Collection Time: 11/21/20  5:00 PM  Result Value Ref Range   Lactic Acid, Venous 3.9 (HH) 0.5 - 1.9 mmol/L    Comment: CRITICAL RESULT CALLED TO, READ BACK BY AND VERIFIED WITH: J.NASH, RN AT 1815 ON 06.08.22 BY N.[email or web address] Performed at [facility], [address] [place], [place] [address]   Resp Panel by RT-PCR (Flu A&B, Covid) Nasopharyngeal Swab     Status: None   Collection Time: 11/21/20  5:30 PM   Specimen: Nasopharyngeal Swab; Nasopharyngeal(NP) swabs in vial transport medium  Result Value Ref Range   SARS Coronavirus 2 by RT PCR NEGATIVE NEGATIVE    Comment: (NOTE) SARS-CoV-2 target nucleic acids are NOT DETECTED.  The SARS-CoV-2 RNA is generally detectable in upper respiratory specimens during the acute phase of infection. The  lowest concentration of SARS-CoV-2 viral copies this assay can detect is 138 copies/mL. A negative result does not preclude SARS-Cov-2 infection and should not be used as the sole basis for treatment or other patient management decisions. A negative result may occur with  improper specimen collection/handling, submission of specimen other than nasopharyngeal swab, presence of viral mutation(s) within the areas targeted by this assay, and inadequate number of viral copies(<138 copies/mL). A negative result must be combined with clinical observations, patient history,  and epidemiological information. The expected result is Negative.  Fact Sheet for Patients:  BloggerCourse.comhttps://www.fda.gov/media/152166/download  Fact Sheet for Healthcare Providers:  SeriousBroker.ithttps://www.fda.gov/media/152162/download  This test is no t yet approved or cleared by the [place] FDA and  has been authorized for detection and/or diagnosis of SARS-CoV-2 by FDA under an Emergency Use Authorization (EUA). This EUA will remain  in effect (meaning this test can be used) for the duration of the COVID-19 declaration under Section 564(b)(1) of the Act, 21 U.S.C.section 360bbb-3(b)(1), unless the authorization is terminated  or revoked sooner.       Influenza A by PCR NEGATIVE NEGATIVE   Influenza B by PCR NEGATIVE NEGATIVE    Comment: (NOTE) The Xpert Xpress SARS-CoV-2/FLU/RSV plus assay is intended as an aid in the diagnosis of influenza from Nasopharyngeal swab specimens and should not be used as a sole basis for treatment. Nasal washings and aspirates are unacceptable for Xpert Xpress SARS-CoV-2/FLU/RSV testing.  Fact Sheet for Patients: BloggerCourse.comhttps://www.fda.gov/media/152166/download  Fact Sheet for Healthcare Providers: SeriousBroker.ithttps://www.fda.gov/media/152162/download  This test is not yet approved or cleared by the [place] FDA and has been authorized for detection and/or diagnosis of SARS-CoV-2 by FDA under an Emergency  Use Authorization (EUA). This EUA will remain in effect (meaning this test can be used) for the duration of the COVID-19 declaration under Section 564(b)(1) of the Act, 21 U.S.C. section 360bbb-3(b)(1), unless the authorization is terminated or revoked.  Performed at [facility], [address] [place], [place] [address]   Protime-INR     Status: None   Collection Time: 11/21/20  6:30 PM  Result Value Ref Range   Prothrombin Time 13.2 11.4 - 15.2 seconds   INR 1.0 0.8 - 1.2    Comment: (NOTE) INR goal varies based on device and disease states. Performed at [facility], [address] [place], [place] [address]   APTT     Status: None   Collection Time: 11/21/20  6:30 PM  Result Value Ref Range   aPTT 28 24 - 36 seconds    Comment: Performed at [facility], [address] [place], [place] [address]  Magnesium     Status: None   Collection Time: 11/21/20  6:30 PM  Result Value Ref Range   Magnesium 1.9 1.7 - 2.4 mg/dL    Comment: Performed at [facility], [address] [place], [place] [address]  Lactic acid, plasma     Status: Abnormal   Collection Time: 11/21/20  6:33 PM  Result Value Ref Range   Lactic Acid, Venous 3.3 (HH) 0.5 - 1.9 mmol/L    Comment: CRITICAL VALUE NOTED.  VALUE IS CONSISTENT WITH PREVIOUSLY REPORTED AND CALLED VALUE. Performed at [facility], [address] [place], [place] [address]    DG Chest Portable 1 View  Result Date: 11/21/2020 CLINICAL DATA:  Weakness EXAM: PORTABLE CHEST 1 VIEW COMPARISON:  None. FINDINGS: The heart size and mediastinal contours are within normal limits. No focal airspace disease. No pleural effusion or pneumothorax. No acute osseous abnormality. IMPRESSION: No evidence of acute cardiopulmonary disease. Electronically Signed   By: Caprice RenshawJacob  Kahn   On: 11/21/2020 18:06    Pending Labs Unresulted Labs (From admission, onward)           Start     Ordered   11/22/20 0500  Magnesium  Tomorrow morning,   R       Question:  Specimen collection method  Answer:  Lab=Lab collect   11/21/20 1848   11/22/20 0500  Comprehensive metabolic panel  Tomorrow morning,   R       Question:  Specimen collection method  Answer:  Lab=Lab collect   11/21/20 1848   11/22/20 0500  CBC with Differential/Platelet  Tomorrow morning,   R       Question:  Specimen collection method  Answer:  Lab=Lab collect   11/21/20 1848   11/21/20 1824  Blood Culture (routine x 2)  (Septic presentation on arrival (screening labs, nursing and treatment orders for obvious sepsis))  BLOOD CULTURE X 2,   STAT      11/21/20 1824   11/21/20 1824  Urine culture  (Septic presentation on arrival (screening labs, nursing and treatment orders for obvious sepsis))  ONCE - STAT,   STAT        11/21/20 1824   11/21/20 1608  Urine C&S  Once,   STAT        11/21/20 1607          Vitals/Pain Today's Vitals   11/21/20 1900 11/21/20 2000 11/21/20 2030 11/21/20 2100  BP: (!) 152/86 (!) 172/83 (!) 176/87 (!) 176/95  Pulse: 89 87 90 93  Resp:  15 19 17   Temp:      TempSrc:      SpO2: 100% 97% 100% 100%  Weight:      Height:      PainSc:        Isolation Precautions No active isolations  Medications Medications  acetaminophen (TYLENOL) tablet 650 mg (has no administration in time range)    Or  acetaminophen (TYLENOL) suppository 650 mg (has no administration in time range)  cefTRIAXone (ROCEPHIN) 1 g in sodium chloride 0.9 % 100 mL IVPB (has no administration in time range)  lactated ringers infusion ( Intravenous New Bag/Given 11/21/20 1852)  cefTRIAXone (ROCEPHIN) 1 g in sodium chloride 0.9 % 100 mL IVPB (0 g Intravenous Stopped 11/21/20 1829)  lactated ringers bolus 2,250 mL (0 mLs Intravenous Stopped 11/21/20 2112)    Mobility non-ambulatory

## 2020-11-21 NOTE — Telephone Encounter (Signed)
Called patients wife and informed her that the hospital bed rx was sent to [facility] and provided patietns wife with phone number. No further questions or concerns.

## 2020-11-21 NOTE — Telephone Encounter (Signed)
Patients hospital bed was sent to [facility]. Phone number is: [phone].

## 2020-11-21 NOTE — H&P (Signed)
History and Physical    PLEASE NOTE THAT DRAGON DICTATION SOFTWARE WAS USED IN THE CONSTRUCTION OF THIS NOTE.   Ryan Morales NLG:[id number] DOB: 06-24-1945 DOA: 11/21/2020  PCP: Loraine Leriche., MD Patient coming from: home   I have personally briefly reviewed patient's old medical records in [place]  Chief Complaint: altered mental status  HPI: Ryan Morales is a 75 y.o. male with medical history significant for Parkinson's Disease, chronic indwelling foley catheter in setting of BPH, DM2, OSA on QHS CPAP, HLD, who is admitted to [facility] on 11/21/2020 with suspected acute metabolic encephalopathy in setting of severe sepsis due to UTI after presenting from home to [facility] ED for eval of altered mental status.   The following history provided by the patient as well as my discussions with the patient's wife, who was present at bedside, in addition to my discussions with the ED physician and via chart review.    In the setting of baseline dementia in the context of Parkinson's disease, the patient's wife reports that she has noted the patient to demonstrate evidence of acute confusion relative to this baseline mental status over the course of the last 1 week.  She notes that this change in mental status has been abrupt in onset, as opposed to representing a gradual decline in the patient's mental status over the last few months.  This has been associated with generalized weakness in the absence of any acute focal weakness, acute focal paresthesias, numbness, vertigo, facial droop, dysarthria.  At baseline, the patient is able to ambulate with a four-wheel walker, without significant additional assistance, and requiring only passive assistance with transfers.  However, in the setting of recent generalized weakness over the course of the last week, patient's wife conveys that the patient is now requiring significant assistance with transfers, noting that the level of assistance he is  requiring with such is then she is able to provide over the course of the last week.  Additionally, over the last week, the patient is able to provide little assistance with completion of his ADLs, representing a sharp contrast to the significant amount of assistance that he is typically able to provide with such leading up to the last week.  Once again, she reports this generalized weakness over the course the last week represents an abrupt change relative to the patient's baseline functional status leading up to the last week  She does not believe that the patient has been experiencing any recent subjective fever, chills, rigors or generalized myalgias.  He has not been complaining of any recent headaches, neck stiffness, rhinitis, rhinorrhea, sore throat, shortness of breath nausea, vomiting, abdominal pain, diarrhea, or rash.  No recent travel or known COVID-19 exposures.  No recent chest pain, diaphoresis, or palpitations.  Medical history notable for chronic indwelling Foley catheter in the setting of BPH status post TURP in 2017.  No recent trauma.    ED Course:  Vital signs in the ED were notable for the following:  - Temperature max 97.9, heart rate 80-94; blood pressure 123/85 152/86; respiratory rate 14-19; oxygen saturation 97 to 100% on room air.  Labs were notable for the following: BMP was notable for the following: Sodium 135, bicarbonate 27, creatinine 0.87.  CBC notable for white blood cell count of 12,200.  INR 1.0.  Initial lactate noted to be 3.9, with repeat value trending down to 3.3 following interval IV fluids, as further detailed below.  Urinalysis was associate with a cloudy  specimen it was notable for greater than 50 white blood cells, large leukocyte Estrace, and 20 ketones.  Nasopharyngeal COVID-19/influenza PCR performed in the ED today and found to be negative.  Blood cultures x2 and urine culture were collected prior to initiation of antibiotics.  EKG showed sinus rhythm  with heart rate 86, right bundle branch block, QRS 144m, and no evidence of T wave or ST changes, including no evidence of ST elevation.  Chest x-ray showed no evidence of acute cardiopulmonary process, including no evidence infiltrate, edema, effusion, or pneumothorax.  While in the ED, the following were administered: Rocephin 1 g IV x1, and lactated ringer bolus x2250 mL.  Subsequently, the patient was admitted to the PCU for further evaluation management of presenting suspected acute metabolic encephalopathy in the setting of severe sepsis due to UTI.    Review of Systems: As per HPI otherwise 10 point review of systems negative.   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 06/16/2017   Obstructive sleep apnea 09/01/2016   Uses CPAP nightly   Parkinson's disease with neurogenic orthostatic hypotension    Presbyopia of both eyes 10/04/2019   Pseudophakia of both eyes 10/04/2019   RLS (restless legs syndrome) 09/07/2017   Type 2 diabetes mellitus without complication, without long-term current use of insulin 08/08/2019    Past Surgical History:  Procedure Laterality Date   APPENDECTOMY     BELPHAROPTOSIS REPAIR Bilateral    CATARACT EXTRACTION Bilateral    TONSILLECTOMY     TRANSURETHRAL RESECTION OF PROSTATE      Social History:  reports that he has been smoking cigars. He has never used smokeless tobacco. He reports previous alcohol use. He reports that he does not use drugs.   Allergies  Allergen Reactions   Bactrim [Sulfamethoxazole-Trimethoprim] Hives    Family History  Problem Relation Age of Onset   Diabetes Mother     Congestive Heart Failure Mother    High Cholesterol Father    Prostate cancer Father    Stroke Father    Other Sister        Brain tumor    Family history reviewed and not pertinent    Prior to Admission medications   Medication Sig Start Date End Date Taking? Authorizing Provider  atorvastatin (LIPITOR) 40 MG tablet Take 40 mg by mouth daily.    [provider]  carbidopa-levodopa (SINEMET CR) 50-200 MG tablet TAKE 1 TABLET AT BEDTIME 08/22/20   Tat, REustace Quail DO  carbidopa-levodopa (SINEMET IR) 25-100 MG tablet 2 tablets at 10 AM/2 tablet at 1PM/2 tablets at 4pm/1 tablet at 7pm 06/21/20   Tat, Rebecca S, DO  escitalopram (LEXAPRO) 10 MG tablet Take 1 tablet (10 mg total) by mouth daily. 11/09/20   Tat, REustace Quail DO  Famotidine 20 MG CHEW Chew by mouth. BID PRN    [provider]  finasteride (PROSCAR) 5 MG tablet Take 5 mg by mouth daily.    [provider]  gabapentin (NEURONTIN) 300 MG capsule Take 600 mg by mouth at bedtime.     [provider]  ketoconazole (NIZORAL) 2 % shampoo Apply 1 application topically 2 (  two) times a week.    [provider]  MELATONIN PO Take 6 mg by mouth at bedtime.     [provider]  memantine (NAMENDA) 5 MG tablet TAKE 1 TABLET TWICE A DAY 11/14/20   Tat, Eustace Quail, DO  metFORMIN (GLUCOPHAGE) 1000 MG tablet Take 1,000 mg by mouth 2 (two) times daily with a meal.    [provider]  midodrine (PROAMATINE) 10 MG tablet Take 1 tablet by mouth daily. Can take additional tab if systolic under 191 47/82/95   [provider]  mometasone (ELOCON) 0.1 % cream Apply 1 application topically daily.    [provider]  NONFORMULARY OR COMPOUNDED ITEM ONE DEVICE Dispense U-step 2  DX: g20 03/15/20   Tat, Eustace Quail, DO  rivastigmine (EXELON) 9.5 mg/24hr Place 9.5 mg onto the skin daily.    [provider]     Objective    Physical Exam: Vitals:   11/21/20 1613 11/21/20 1700  11/21/20 1713 11/21/20 1730  BP:  135/74  136/73  Pulse:  85  83  Resp:   17 17  Temp:      TempSrc:      SpO2:  100%  99%  Weight: 72.6 kg     Height: _0  (1.702 m)        General: appears to be stated age; alert; oriented to self and person (able to correctly ID current [place] president), but not to place, time (thinks that current year is 2010) Skin: warm, dry, no rash Head:  AT/[place] Mouth:  Oral mucosa membranes appear dry, normal dentition Neck: supple; trachea midline Heart:  RRR; did not appreciate any M/R/G Lungs: CTAB, did not appreciate any wheezes, rales, or rhonchi Abdomen: + BS; soft, ND, NT Vascular: 2+ pedal pulses b/l; 2+ radial pulses b/l Extremities: no peripheral edema, no muscle wasting Neuro: In the setting of the patient's current mental status and associated inability to follow instructions, unable to perform full neurologic exam at this time.  As such, assessment of strength, sensation, and cranial nerves is limited at this time. Patient noted to spontaneously move all 4 extremities.     Labs on Admission: I have personally reviewed following labs and imaging studies  CBC: Recent Labs  Lab 11/21/20 1608  WBC 12.2*  HGB 12.3*  HCT 36.6*  MCV 88.4  PLT 621   Basic Metabolic Panel: Recent Labs  Lab 11/21/20 1608  NA 135  K 4.0  CL 97*  CO2 27  GLUCOSE 191*  BUN 16  CREATININE 0.87  CALCIUM 9.6   GFR: Estimated Creatinine Clearance: 69.6 mL/min (by C-G formula based on SCr of 0.87 mg/dL). Liver Function Tests: No results for input(s): AST, ALT, ALKPHOS, BILITOT, PROT, ALBUMIN in the last 168 hours. No results for input(s): LIPASE, AMYLASE in the last 168 hours. No results for input(s): AMMONIA in the last 168 hours. Coagulation Profile: No results for input(s): INR, PROTIME in the last 168 hours. Cardiac Enzymes: No results for input(s): CKTOTAL, CKMB, CKMBINDEX, TROPONINI in the last 168 hours. BNP (last 3 results) No results for  input(s): PROBNP in the last 8760 hours. HbA1C: No results for input(s): HGBA1C in the last 72 hours. CBG: No results for input(s): GLUCAP in the last 168 hours. Lipid Profile: No results for input(s): CHOL, HDL, LDLCALC, TRIG, CHOLHDL, LDLDIRECT in the last 72 hours. Thyroid Function Tests: No results for input(s): TSH, T4TOTAL, FREET4, T3FREE, THYROIDAB in the last 72 hours. Anemia Panel: No  results for input(s): VITAMINB12, FOLATE, FERRITIN, TIBC, IRON, RETICCTPCT in the last 72 hours. Urine analysis:    Component Value Date/Time   COLORURINE AMBER (A) 11/21/2020 1608   APPEARANCEUR CLOUDY (A) 11/21/2020 1608   LABSPEC 1.019 11/21/2020 1608   PHURINE 5.0 11/21/2020 1608   GLUCOSEU 50 (A) 11/21/2020 1608   GLUCOSEU 100 (A) 06/21/2020 1657   HGBUR MODERATE (A) 11/21/2020 1608   BILIRUBINUR NEGATIVE 11/21/2020 1608   KETONESUR 20 (A) 11/21/2020 1608   PROTEINUR 100 (A) 11/21/2020 1608   UROBILINOGEN 0.2 06/21/2020 1657   NITRITE NEGATIVE 11/21/2020 1608   LEUKOCYTESUR LARGE (A) 11/21/2020 1608    Radiological Exams on Admission: DG Chest Portable 1 View  Result Date: 11/21/2020 CLINICAL DATA:  Weakness EXAM: PORTABLE CHEST 1 VIEW COMPARISON:  None. FINDINGS: The heart size and mediastinal contours are within normal limits. No focal airspace disease. No pleural effusion or pneumothorax. No acute osseous abnormality. IMPRESSION: No evidence of acute cardiopulmonary disease. Electronically Signed   By: Maurine Simmering   On: 11/21/2020 18:06     EKG: Independently reviewed, with result as described above.    Assessment/Plan   Ryan Morales is a 75 y.o. male with medical history significant for Parkinson's Disease, chronic indwelling foley catheter in setting of BPH, DM2, OSA on QHS CPAP, HLD, who is admitted to [facility] on 11/21/2020 with suspected acute metabolic encephalopathy in setting of severe sepsis due to UTI after presenting from home to [facility] ED for eval of altered  mental status.    Principal Problem:   Acute metabolic encephalopathy Active Problems:   Hyperlipidemia   Obstructive sleep apnea   Parkinson's disease with neurogenic orthostatic hypotension   Type 2 diabetes mellitus without complication, without long-term current use of insulin   Severe sepsis (HCC)   Acute lower UTI   Lactic acidosis   Generalized weakness     #) Acute metabolic encephalopathy: dx on basis of 1 week of progressive confusion relative to baseline mental status, with suspected significant metabolic contribution on the basis of physiologic stressors stemming from presenting severe sepsis on the basis of suspected UTI, as further detailed below. This is the context of and in contrast to reported baseline dementia in setting of Parkinson's disease. No obvious additional contributory infectious process at this time, including CXR, which shows no acute cardio-pulmonary process and COVID-19 PCR result noted to be negative when performed in ED today.  The patient is on multiple central acting meds at home which could be providing some contribution to presenting altered mental status, including Lexapro and Gabapentin, although these meds do not appear to be associated with any recent changes to his home med regimen, including no associated recent dose modifcations.  No additional overt metabolic/electrolyte contributions at this time. While presentation is a/w generalized weakness, there are no overt acute focal neurologic deficits to suggest a contribution from an underlying acute CVA. Seizures are also felt to be unlikely. Will keep patient NPO until mental status improves sufficiently that patient is able to participate in and pass nursing bedside swallow evaluation.    Plan: NPO. Nursing bedside swallow evaluation prior to the initiation of a diet/oral medications, as described above. CMP in the morning. Repeat CBC in the morning. check VBG to evaluate for any contribution from  hypercapnic encephalopathy. Check TSH. check B12 and UDS. Will also hold home statin, Gabapentin, and Lexapro to eliminate any potential confounding contribution to patient's presenting altered mental status via these home meds. Further eval  and management of presenting severe sepsis due to UTI, as further described below. fall precautions.       #) Severe sepsis due to UTI: SIRS criteria met via leukocytosis and tachycardia, with presenting UA suspected to be consistent with UTI in setting of associated cloudy specimen with greater than 50 wbcs and large LE. While interpretation of UA results are complicated by the presence of chronic indwelling foley catheter, the above presentation/lab results are suspected to be consistent with UTI given abrupt significant recent clinical changes in patient's mental/functional status, as above. Sepsis meets criteria to be considered severe in nature given concominant evidence of end organ damage in the form of elevated initial LA of 3.9, with ensuing improvement to 3.3, in additional to presenting suspected acute metabolic encephalopathy, as above. No evidence of hypotension or additional underlying infectious source, as further detailed above. s/p 30 mL/kg IVF bolus administered in ED this evening. Blood cx's and urine cx collected prior to administration of Rocephin.   Of note, in terms of presenting LA elevation, there may also be a non-infectious contribution from starvation keto-lacticacidosis in setting of recent decline in PO intake, with UA notable for presence of 20 ketones.    Plan: continuous LR @ 75 cc/hr x 12 hours, with repeat LA ordered to be checked in the AM. Continue Rocephin. Monitor for results of blood / urine cx's. Repeat cbc with diff in the AM. CMP in the AM. Monitor on tele.       #) Generalized weakness: 1 week of progressive generalized weakness, in the absence of any evidence of acute focal neurologic deficits, including no evidence  of acute focal weakness. Consequently, acute ischemic CVA is felt to be less likely at this time. Resulting in difficultly ambulating relative to reported baseline in which patient reportedly able to ambulate with 4 wheel walker and no significant additional assistance, including passive assistance with transfers. Also a/w significant decrease in pt's ability to contribute to ADL completion over last 1 week, prompting patient's wife to convey concern over her ability to provide adequate care to patient at home in his current state over the course of the preceeding last week. Wife emphasizes that the above represents a significant decline in the patient's baseline functional status over the course of the last 1 week as opposed to representing a gradual decline of such over the last few months. Suspect contribution from physiologic stress stemming from presenting severe sepsis due to UTI, as further described above.    Plan: work-up and management of presenting severe sepsis due UTI, as described above. Physical therapy consult has been placed. Fall precaustions. Check TSH and MMA. Holding home Gabapentin and Lexapro for now, as further detailed above.      #) Parkinson's Disease: on Levodopa/Carbidopa as well as Rivastigmine as outpatient.    Plan: continue home Levodopa/Carbidopa as well as Rivastigmine.       #) DM2: documented h/o such, on Metformin as outpatient in the absence of an exogenous insulin. Presenting blood sugar per bmp noted to be 191.   Plan: hold home metformin during this hospitalization. In setting of current npo status, I have ordered accuchecks q6H with low dose SSI.        #) OSA: with reported good compliance on home nocturnal CPAP.   Plan: RT consult placed requesting continuation of home nocturnal CPAP use during this hospitalization.       #) HLD: on high-intensity atorvastatin as outpatient.   Plan: in setting of presenting  acute encephalopathy, will  hold home statin for now.        DVT prophylaxis: scd's  Code Status: Full code Family Communication: case discussed with patient's wife, who is present at bedside Disposition Plan: Per Rounding Team Consults called: none  Admission status: inpatient; pcu (in setting of degree of elevation of LA)     Of note, this patient was added by me to the following Admit List/Treatment Team: [email or web address].      PLEASE NOTE THAT DRAGON DICTATION SOFTWARE WAS USED IN THE CONSTRUCTION OF THIS NOTE.   [place] [facility] Pager [phone] From [place]  Otherwise, please contact night-coverage  [email or web address] Password [facility]   11/21/2020, 6:48 PM

## 2020-11-21 NOTE — Sepsis Progress Note (Signed)
elink monitoring code sepsis.  

## 2020-11-21 NOTE — ED Triage Notes (Signed)
Pt BIB EMS from home c/o UTI sx x 1 week. Noticed blood in urine on Thursday. Being followed by urology. Catheter in place. Denies abdominal pain. Endorses pain at catheter site. A&ox3 (self, time, place).

## 2020-11-22 DIAGNOSIS — E872 Acidosis, unspecified: Secondary | ICD-10-CM | POA: Diagnosis present

## 2020-11-22 DIAGNOSIS — R531 Weakness: Secondary | ICD-10-CM

## 2020-11-22 DIAGNOSIS — N390 Urinary tract infection, site not specified: Secondary | ICD-10-CM | POA: Diagnosis present

## 2020-11-22 DIAGNOSIS — R6520 Severe sepsis without septic shock: Secondary | ICD-10-CM | POA: Diagnosis present

## 2020-11-22 LAB — COMPREHENSIVE METABOLIC PANEL
ALT: 19 U/L (ref 0–44)
AST: 23 U/L (ref 15–41)
Albumin: 3.7 g/dL (ref 3.5–5.0)
Alkaline Phosphatase: 72 U/L (ref 38–126)
Anion gap: 6 (ref 5–15)
BUN: 11 mg/dL (ref 8–23)
CO2: 30 mmol/L (ref 22–32)
Calcium: 9.2 mg/dL (ref 8.9–10.3)
Chloride: 104 mmol/L (ref 98–111)
Creatinine, Ser: 0.86 mg/dL (ref 0.61–1.24)
GFR, Estimated: >60 mL/min (ref >60)
Glucose, Bld: 139 mg/dL — ABNORMAL HIGH (ref 70–99)
Potassium: 4.4 mmol/L (ref 3.5–5.1)
Sodium: 140 mmol/L (ref 135–145)
Total Bilirubin: 1.1 mg/dL (ref 0.3–1.2)
Total Protein: 6.6 g/dL (ref 6.5–8.1)

## 2020-11-22 LAB — LACTIC ACID, PLASMA: Lactic Acid, Venous: 1.2 mmol/L (ref 0.5–1.9)

## 2020-11-22 LAB — CBC WITH DIFFERENTIAL/PLATELET
Abs Immature Granulocytes: 0.02 10*3/uL (ref 0.00–0.07)
Basophils Absolute: 0.1 10*3/uL (ref 0.0–0.1)
Basophils Relative: 1 %
Eosinophils Absolute: 0.2 10*3/uL (ref 0.0–0.5)
Eosinophils Relative: 3 %
HCT: 33.9 % — ABNORMAL LOW (ref 39.0–52.0)
Hemoglobin: 11.3 g/dL — ABNORMAL LOW (ref 13.0–17.0)
Immature Granulocytes: 0 %
Lymphocytes Relative: 23 %
Lymphs Abs: 1.4 10*3/uL (ref 0.7–4.0)
MCH: 29.8 pg (ref 26.0–34.0)
MCHC: 33.3 g/dL (ref 30.0–36.0)
MCV: 89.4 fL (ref 80.0–100.0)
Monocytes Absolute: 0.6 10*3/uL (ref 0.1–1.0)
Monocytes Relative: 9 %
Neutro Abs: 4.1 10*3/uL (ref 1.7–7.7)
Neutrophils Relative %: 64 %
Platelets: 223 10*3/uL (ref 150–400)
RBC: 3.79 MIL/uL — ABNORMAL LOW (ref 4.22–5.81)
RDW: 12.9 % (ref 11.5–15.5)
WBC: 6.4 10*3/uL (ref 4.0–10.5)
nRBC: 0 % (ref 0.0–0.2)

## 2020-11-22 LAB — BLOOD GAS, VENOUS
Acid-Base Excess: 5.1 mmol/L — ABNORMAL HIGH (ref 0.0–2.0)
Bicarbonate: 30.7 mmol/L — ABNORMAL HIGH (ref 20.0–28.0)
O2 Saturation: 70 %
Patient temperature: 98.6
pCO2, Ven: 52.9 mmHg (ref 44.0–60.0)
pH, Ven: 7.382 (ref 7.250–7.430)
pO2, Ven: 39.7 mmHg (ref 32.0–45.0)

## 2020-11-22 LAB — GLUCOSE, CAPILLARY
Glucose-Capillary: 124 mg/dL — ABNORMAL HIGH (ref 70–99)
Glucose-Capillary: 132 mg/dL — ABNORMAL HIGH (ref 70–99)
Glucose-Capillary: 142 mg/dL — ABNORMAL HIGH (ref 70–99)
Glucose-Capillary: 174 mg/dL — ABNORMAL HIGH (ref 70–99)

## 2020-11-22 LAB — RAPID URINE DRUG SCREEN, HOSP PERFORMED
Amphetamines: NOT DETECTED
Barbiturates: NOT DETECTED
Benzodiazepines: NOT DETECTED
Cocaine: NOT DETECTED
Opiates: NOT DETECTED
Tetrahydrocannabinol: NOT DETECTED

## 2020-11-22 LAB — TSH: TSH: 2.293 u[IU]/mL (ref 0.350–4.500)

## 2020-11-22 LAB — MAGNESIUM: Magnesium: 1.6 mg/dL — ABNORMAL LOW (ref 1.7–2.4)

## 2020-11-22 MED ORDER — CARBIDOPA-LEVODOPA 25-100 MG PO TABS
2.0000 | ORAL_TABLET | ORAL | Status: DC
  Administered 2020-11-23 – 2020-11-29 (×20): 2 via ORAL
  Filled 2020-11-22 (×19): qty 2

## 2020-11-22 MED ORDER — ENSURE ENLIVE PO LIQD
237.0000 mL | Freq: Two times a day (BID) | ORAL | Status: DC
  Administered 2020-11-22 – 2020-11-29 (×13): 237 mL via ORAL

## 2020-11-22 MED ORDER — MAGNESIUM SULFATE 2 GM/50ML IV SOLN
2.0000 g | Freq: Once | INTRAVENOUS | Status: AC
  Administered 2020-11-22: 2 g via INTRAVENOUS
  Filled 2020-11-22: qty 50

## 2020-11-22 MED ORDER — CARBIDOPA-LEVODOPA ER 50-200 MG PO TBCR
1.0000 | EXTENDED_RELEASE_TABLET | Freq: Every day | ORAL | Status: DC
  Administered 2020-11-22 – 2020-11-28 (×7): 1 via ORAL
  Filled 2020-11-22 (×7): qty 1

## 2020-11-22 MED ORDER — CARBIDOPA-LEVODOPA 25-100 MG PO TABS
1.0000 | ORAL_TABLET | ORAL | Status: DC
  Administered 2020-11-23 – 2020-11-28 (×6): 1 via ORAL
  Filled 2020-11-22 (×6): qty 1

## 2020-11-22 MED ORDER — ADULT MULTIVITAMIN W/MINERALS CH
1.0000 | ORAL_TABLET | Freq: Every day | ORAL | Status: DC
  Administered 2020-11-23 – 2020-11-28 (×6): 1 via ORAL
  Filled 2020-11-22 (×6): qty 1

## 2020-11-22 NOTE — Progress Notes (Signed)
Initial Nutrition Assessment  DOCUMENTATION CODES:   Not applicable  INTERVENTION:  - will order Ensure Enlive BID, each supplement provides 350 kcal and 20 grams of protein. - will order Magic Cup with lunch meals, each supplement provides 290 kcal and 9 grams of protein. - will order 1 tablet multivitamin with minerals/day. - complete NFPE when feasible.    NUTRITION DIAGNOSIS:   Increased nutrient needs related to acute illness (severe sepsis on admission d/t UTI) as evidenced by estimated needs.   GOAL:   Patient will meet greater than or equal to 90% of their needs  MONITOR:   PO intake, Supplement acceptance, Labs, Weight trends  REASON FOR ASSESSMENT:   Malnutrition Screening Tool  ASSESSMENT:   75 y.o. male with medical history of Parkinson's disease, chronic indwelling foley catheter d/t BPH, type 2 DM, OSA on nightly CPAP, and HLD. He presented to the ED on 6/8 with AMS/acute metabolic encephalopathy 2/2 severe sepsis d/t UTI.  Diet advanced from NPO to Dysphagia 3, thin liquids today at 0945. Lunch tray ordered at 1215; has not yet arrived.   Patient noted to be a/o to self and place. He was sleeping soundly (snoring) at the time of visit with no family or visitors present. Did not awake patient given his mentation. Attempted to call wife at the phone number listed on Face Sheet but got her voicemail.   Patient has not been seen by a [place] RD at any time in the past.  Note in MST report indicates that patient/family reported 5-10 lb weight loss "recently".   Weight today is 170 lb which is consistent with weight on 11/09/20. Weight on 06/21/20 was 186 lb which indicates 16 lb weight loss (8.6% body weight) in the past 5 months.   Per notes: - acute metabolic encephalopathy - severe sepsis d/t UTI for patient with chronic indwelling foley - generalized weakness PTA - hx of Parkinson's disease   Labs reviewed; CBG: 132 mg/dl, Mg: 1.6 mg/dl. Medications  reviewed; sliding scale novolog, 2 g IV Mg sulfate x1 run 6/9.    NUTRITION - FOCUSED PHYSICAL EXAM:  Unable to complete at this time.   Diet Order:   Diet Order             DIET DYS 3 Room service appropriate? Yes; Fluid consistency: Thin  Diet effective now                   EDUCATION NEEDS:   Not appropriate for education at this time  Skin:  Skin Assessment: Reviewed RN Assessment  Last BM:  6/7 (per patient or family report)  Height:   Ht Readings from Last 1 Encounters:  11/21/20 5\' 7"  (1.702 m)    Weight:   Wt Readings from Last 1 Encounters:  11/22/20 77 kg      Estimated Nutritional Needs:  Kcal:  1900-2100 kcal Protein:  90-105 grams Fluid:  >/= 2.5 L/day      01/22/21, MS, RD, LDN, CNSC Inpatient Clinical Dietitian RD pager # available in AMION  After hours/weekend pager # available in [facility]

## 2020-11-22 NOTE — Care Management (Addendum)
Transition of Care (TOC) -30 day Note       Patient Details   Name: Ryan Morales  OMV:[id number]  Date of Birth:[birth year]     Transition of Care [facility]) CM/SW Contact   Name:Melrose Kearse [facility]  Phone Number:[phone]  Date:11/22/2020  Time:3:11p     MUST GG:[id number]     To Whom it May Concern:     Please be advised that the above patient will require a short-term nursing home stay, anticipated 30 days or less rehabilitation and strengthening. The plan is for return home.

## 2020-11-22 NOTE — Progress Notes (Addendum)
SLP Cancellation Note  Patient Details Name: Tajah Schreiner MRN: [id number] DOB: Jul 20, 1945   Cancelled treatment:       Reason Eval/Treat Not Completed: Other (comment) (SLP attempting to arrange MBS for this pt - as per neurology office visit 10/2020 pt was to have an OP MBS.  Per RN, pt had not taken his Parkinson's medications as pharmacy medical requisition form with updated dosage)  SLP messaged Dr Rito Ehrlich re: recommendation for MBS in lieu of BSE and received approval. Thank you!    [name], MS [facility] SLP Acute Rehab Services Office [phone] Pager [phone]  [name] 11/22/2020, 4:45 PM

## 2020-11-22 NOTE — [place] FL2 (Signed)
[place] MEDICAID FL2 LEVEL OF CARE SCREENING TOOL     IDENTIFICATION  Patient Name: Ryan Morales Birthdate: September 03, 1945 Sex: male Admission Date (Current Location): 11/21/2020  [facility] and [place] Number:  Producer, television/film/video and Address:  [facility],  [address] [place], [place] [address]      Provider Number: [id number]  Attending Physician Name and Address:  Osvaldo Shipper, MD  Relative Name and Phone Number:  Waqas Bruhl spouse [phone]    Current Level of Care: Hospital Recommended Level of Care: Skilled Nursing Facility Prior Approval Number:    Date Approved/Denied:   PASRR Number:    Discharge Plan: SNF    Current Diagnoses: Patient Active Problem List   Diagnosis Date Noted   Severe sepsis (HCC) 11/22/2020   Acute lower UTI 11/22/2020   Lactic acidosis 11/22/2020   Generalized weakness 11/22/2020   Acute metabolic encephalopathy 11/21/2020   Impaired mobility and ADLs 04/02/2020   Dysphagia    Parkinson's disease with neurogenic orthostatic hypotension    Dermatochalasis of both upper eyelids 10/04/2019   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 10/04/2019   Presbyopia of both eyes 10/04/2019   Refractive amblyopia, left 10/04/2019   Pseudophakia of both eyes 10/04/2019   Vitreous floater, bilateral 10/04/2019   Acquired deformity of right foot 08/08/2019   Type 2 diabetes mellitus without complication, without long-term current use of insulin 08/08/2019   Leg cramping 12/28/2017   RLS (restless legs syndrome) 09/07/2017   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   Obstructive sleep apnea 09/01/2016   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   Constipation 02/15/2016   Erectile dysfunction 02/15/2016   Hypertension 02/15/2016   Hyperlipidemia 02/15/2016   Convergence insufficiency 11/08/2013   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/2009    Orientation  RESPIRATION BLADDER Height & Weight     Self  Normal Indwelling catheter Weight: 77 kg Height:  5\' 7"  (170.2 cm)  BEHAVIORAL SYMPTOMS/MOOD NEUROLOGICAL BOWEL NUTRITION STATUS      Incontinent Diet (Dysphagia 3-soft)  AMBULATORY STATUS COMMUNICATION OF NEEDS Skin   Limited Assist Verbally Normal                       Personal Care Assistance Level of Assistance  Bathing, Feeding, Dressing Bathing Assistance: Limited assistance Feeding assistance: Limited assistance       Functional Limitations Info  Sight, Hearing, Speech Sight Info: Impaired (eyeglasses) Hearing Info: Adequate Speech Info: Adequate    SPECIAL CARE FACTORS FREQUENCY  PT (By licensed PT), OT (By licensed OT)     PT Frequency:  (5x week) OT Frequency:  (5x week)            Contractures Contractures Info: Not present    Additional Factors Info  Code Status, Allergies Code Status Info:  (Full) Allergies Info:  (Bactrim (Sulfamethoxazole-trimethoprim)           Current Medications (11/22/2020):  This is the current hospital active medication list Current Facility-Administered Medications  Medication Dose Route Frequency Provider Last Rate Last Admin   acetaminophen (TYLENOL) tablet 650 mg  650 mg Oral Q6H PRN Howerter, Justin B, DO       Or   acetaminophen (TYLENOL) suppository 650 mg  650 mg Rectal Q6H PRN Howerter, Justin B, DO       carbidopa-levodopa (SINEMET IR) 25-100 MG per tablet immediate release 1 tablet  1 tablet Oral QID Howerter, Justin B,  DO       cefTRIAXone (ROCEPHIN) 1 g in sodium chloride 0.9 % 100 mL IVPB  1 g Intravenous Q24H Howerter, Justin B, DO       feeding supplement (ENSURE ENLIVE / ENSURE PLUS) liquid 237 mL  237 mL Oral BID BM Osvaldo Shipper, MD       finasteride (PROSCAR) tablet 5 mg  5 mg Oral Daily Howerter, Justin B, DO   5 mg at 11/22/20 0940   insulin aspart (novoLOG) injection 0-6 Units  0-6 Units Subcutaneous Q6H Howerter, Justin B, DO       memantine  (NAMENDA) tablet 5 mg  5 mg Oral BID Howerter, Justin B, DO   5 mg at 11/22/20 0941   midodrine (PROAMATINE) tablet 10 mg  10 mg Oral Daily Howerter, Justin B, DO       multivitamin with minerals tablet 1 tablet  1 tablet Oral Daily Osvaldo Shipper, MD       rivastigmine (EXELON) 9.5 mg/24hr 9.5 mg  9.5 mg Transdermal Daily Howerter, Justin B, DO         Discharge Medications: Please see discharge summary for a list of discharge medications.  Relevant Imaging Results:  Relevant Lab Results:   Additional Information ss#[id number]  Gennifer Potenza, Olegario Messier, [place]

## 2020-11-22 NOTE — Evaluation (Signed)
Physical Therapy Evaluation Patient Details Name: Ryan Morales MRN: [id number] DOB: 10-Jun-1946 Today's Date: 11/22/2020   History of Present Illness  Ryan Morales is a 75 y.o. male who is admitted to [facility] on 11/21/2020 with suspected acute metabolic encephalopathy in setting of severe sepsis due to UTI. PMH: Parkinson's Disease, chronic indwelling foley catheter in setting of BPH, DM2, OSA, HLD   Clinical Impression  Pt admitted with above diagnosis. Pt reports independent with rollator at baseline, chart reviews states that pt was supv and recently requiring significant assistance from spouse. Pt currently max A for supine to sit, very rigid trunk, possibly due to awaiting swallow study for parkinson's medication per RN. Pt sits EOB with min-max A in upright sitting, multimodal cues for midline and pt slowly digresses into posterior lean requiring assistance to recover; pt tolerates sitting EOB for ~10 min. Pt unable to perform lateral scoot transfer with +1 assist so assisted back to supine. No family present during eval, recommending SNF at this time due to 24/7 assist required and previously supv at baseline. Pt currently with functional limitations due to the deficits listed below (see PT Problem List). Pt will benefit from skilled PT to increase their independence and safety with mobility to allow discharge to the venue listed below.       Follow Up Recommendations SNF    Equipment Recommendations  None recommended by PT    Recommendations for Other Services       Precautions / Restrictions Precautions Precautions: Fall Restrictions Weight Bearing Restrictions: No      Mobility  Bed Mobility Overal bed mobility: Needs Assistance Bed Mobility: Supine to Sit;Sit to Supine  Supine to sit: Max assist;HOB elevated Sit to supine: Max assist   General bed mobility comments: pt inches LEs towards EOB, ultimately requires max A to bring LEs off EOB and upright trunk with  use of bedpad, strong heavy posterior lean requiring min-max A to maintain sitting EOB for ~10 minutes; max A to return to supine, pt able to place LEs into hooklying and assist with scooting up in bed with bedassist on    Transfers  General transfer comment: attempted lateral scooting but unable with +1 assist, strong posterior lean in sitting  Ambulation/Gait                Stairs            Wheelchair Mobility    Modified Rankin (Stroke Patients Only)       Balance Overall balance assessment: Needs assistance Sitting-balance support: Feet supported;Bilateral upper extremity supported Sitting balance-Leahy Scale: Zero Sitting balance - Comments: min-max A for sitting EOB for ~10 minutes, frequent multimodal cues for midline but pt slowly returns to posterior lean Postural control: Posterior lean          Pertinent Vitals/Pain Pain Assessment: No/denies pain    Home Living Family/patient expects to be discharged to:: Private residence Living Arrangements: Spouse/significant other Available Help at Discharge: Family;Available 24 hours/day Type of Home: House Home Access: Stairs to enter;Ramped entrance     Home Layout: One level Home Equipment: Walker - 4 wheels;Cane - single point      Prior Function Level of Independence: Needs assistance         Comments: Per chart review, pt used to be supv with mobility and lately has required heavy assistance from spouse. Pt states he does everything for himself. No family present to confirm.     Hand Dominance  Extremity/Trunk Assessment   Upper Extremity Assessment Upper Extremity Assessment: Generalized weakness    Lower Extremity Assessment Lower Extremity Assessment: Generalized weakness;RLE deficits/detail;LLE deficits/detail RLE Deficits / Details: AROM WNL, strength grossly 3/5 throughout, denies numbness/tingling, very rigid with mobility assessed LLE Deficits / Details: AROM WNL,  strength grossly 3/5 throughout, denies numbness/tingling, very rigid with mobility assessed    Cervical / Trunk Assessment Cervical / Trunk Assessment:  (rigid/stiff trunk with mobility)  Communication   Communication: No difficulties  Cognition Arousal/Alertness: Awake/alert Behavior During Therapy: WFL for tasks assessed/performed Overall Cognitive Status: No family/caregiver present to determine baseline cognitive functioning  General Comments: Pt states name, "hospital", "June", president all correctly. Pt slow to respond to commands, requires increased time to follow 1 step commands, pleasant      General Comments General comments (skin integrity, edema, etc.): SpO2>90% on RA    Exercises     Assessment/Plan    PT Assessment Patient needs continued PT services  PT Problem List Decreased strength;Decreased activity tolerance;Decreased balance;Decreased mobility;Decreased coordination;Decreased knowledge of use of DME       PT Treatment Interventions DME instruction;Gait training;Functional mobility training;Therapeutic activities;Therapeutic exercise;Balance training;Neuromuscular re-education;Patient/family education    PT Goals (Current goals can be found in the Care Plan section)  Acute Rehab PT Goals Patient Stated Goal: agreeable to therapy PT Goal Formulation: With patient Time For Goal Achievement: 12/06/20 Potential to Achieve Goals: Fair    Frequency Min 3X/week   Barriers to discharge        Co-evaluation               AM-PAC PT "6 Clicks" Mobility  Outcome Measure Help needed turning from your back to your side while in a flat bed without using bedrails?: Total Help needed moving from lying on your back to sitting on the side of a flat bed without using bedrails?: Total Help needed moving to and from a bed to a chair (including a wheelchair)?: Total Help needed standing up from a chair using your arms (e.g., wheelchair or bedside chair)?:  Total Help needed to walk in hospital room?: Total Help needed climbing 3-5 steps with a railing? : Total 6 Click Score: 6    End of Session   Activity Tolerance: Patient limited by fatigue (limited by rigidity) Patient left: in bed;with call bell/phone within reach;with bed alarm set Nurse Communication: Mobility status PT Visit Diagnosis: Unsteadiness on feet (R26.81);Other abnormalities of gait and mobility (R26.89);Muscle weakness (generalized) (M62.81);Other symptoms and signs involving the nervous system (Z61.096)    Time: 0454-0981 PT Time Calculation (min) (ACUTE ONLY): 27 min   Charges:   PT Evaluation $PT Eval Moderate Complexity: 1 Mod PT Treatments $Therapeutic Activity: 8-22 mins         Tori Corie Allis PT, DPT 11/22/20, 12:51 PM

## 2020-11-22 NOTE — Progress Notes (Signed)
[facility] PROGRESS NOTE   Ryan Morales MOQ:[id number] DOB: February 17, 1946 DOA: 11/21/2020  PCP: [name], MD  Brief History/Interval Summary: 75 y.o. male with medical history significant for Parkinson's Disease, chronic indwelling foley catheter in setting of BPH, DM2, OSA on QHS CPAP, HLD, who was admitted to [facility] on 11/21/2020 with suspected acute metabolic encephalopathy in setting of severe sepsis due to UTI after presenting from home to [facility] ED for eval of altered mental status.  Reason for Visit: Acute metabolic encephalopathy.  Urinary tract infection  Consultants: None  Procedures: None  Antibiotics: Anti-infectives (From admission, onward)    Start     Dose/Rate Route Frequency Ordered Stop   11/22/20 1800  cefTRIAXone (ROCEPHIN) 1 g in sodium chloride 0.9 % 100 mL IVPB        1 g 200 mL/hr over 30 Minutes Intravenous Every 24 hours 11/21/20 1847     11/21/20 1745  cefTRIAXone (ROCEPHIN) 1 g in sodium chloride 0.9 % 100 mL IVPB        1 g 200 mL/hr over 30 Minutes Intravenous  Once 11/21/20 1740 11/21/20 1829   11/21/20 1645  cefTRIAXone (ROCEPHIN) 1 g in sodium chloride 0.9 % 100 mL IVPB  Status:  Discontinued        1 g 200 mL/hr over 30 Minutes Intravenous  Once 11/21/20 1633 11/21/20 1635       Subjective/Interval History: Patient noted to be mildly confused this morning.  He however does not appear to be in any discomfort.  He denies any abdominal pain nausea vomiting.  He does not remember when was the last time his catheter was changed.  Denies any shortness of breath.    Assessment/Plan:  Acute metabolic encephalopathy Family members noted progressive confusion over the last week or so prior to admission.  Thought to be secondary to urinary tract infection.  No other etiology has been found.  No obvious focal neurological deficits noted.  Seizures thought to be less likely. TSH noted to be normal at 2.29.  Urine drug screen was  unremarkable.  Continue to wait on improvement as infection is treated.  Urinary tract infection in the setting of chronic indwelling Foley catheter with severe sepsis Lactic acid level level was noted to be elevated and has become normal.  Patient noted to be on ceftriaxone.  Follow-up on cultures.  There could be an element of colonization as well. He does not recall when was the last time the catheter was changed out.  Will discuss with family.  Patient was given IV fluids.  Noted to be afebrile currently.  Generalized weakness Likely in the setting of acute infection.  No obvious focal neurological deficits noted.  PT and OT evaluation.  History of Parkinson's disease Noted to be on carbidopa/levodopa.  Also on rivastigmine.  These are being continued.  Diabetes mellitus type 2 without complication On metformin at home.  Monitor CBGs.  History of obstructive sleep apnea CPAP at nighttime.  Hyperlipidemia On atorvastatin at home.    DVT Prophylaxis: SCDs Code Status: Full code Family Communication: No family at bedside.  Disposition Plan: Hopefully return home when improved  Status is: Inpatient  Remains inpatient appropriate because:Altered mental status and IV treatments appropriate due to intensity of illness or inability to take PO  Dispo: The patient is from: Home              Anticipated d/c is to: Home  Patient currently is not medically stable to d/c.   Difficult to place patient No      Medications: Scheduled:  carbidopa-levodopa  1 tablet Oral QID   finasteride  5 mg Oral Daily   insulin aspart  0-6 Units Subcutaneous Q6H   memantine  5 mg Oral BID   midodrine  10 mg Oral Daily   rivastigmine  9.5 mg Transdermal Daily   Continuous:  cefTRIAXone (ROCEPHIN)  IV     VEL:FYBOFBPZWCHEN **OR** acetaminophen   Objective:  Vital Signs  Vitals:   11/22/20 0024 11/22/20 0259 11/22/20 0439 11/22/20 0635  BP:  137/69  140/80  Pulse: 77 86  86   Resp: 14 14  14   Temp:  (!) 97.5 F (36.4 C)  97.7 F (36.5 C)  TempSrc:  Oral  Oral  SpO2: 100% 95%  95%  Weight:   77 kg   Height:        Intake/Output Summary (Last 24 hours) at 11/22/2020 0930 Last data filed at 11/22/2020 0300 Gross per 24 hour  Intake 609.23 ml  Output 2100 ml  Net -1490.77 ml   Filed Weights   11/21/20 1613 11/22/20 0439  Weight: 72.6 kg 77 kg    General appearance: Awake alert.  In no distress.  Mildly confused Resp: Clear to auscultation bilaterally.  Normal effort Cardio: S1-S2 is normal regular.  No S3-S4.  No rubs murmurs or bruit GI: Abdomen is soft.  Nontender nondistended.  Bowel sounds are present normal.  No masses organomegaly Indwelling Foley catheter noted.  Yellowish urine. Extremities: No edema.  Full range of motion of lower extremities. Neurologic: No focal neurological deficits.    Lab Results:  Data Reviewed: I have personally reviewed following labs and imaging studies  CBC: Recent Labs  Lab 11/21/20 1608 11/22/20 0529  WBC 12.2* 6.4  NEUTROABS  --  4.1  HGB 12.3* 11.3*  HCT 36.6* 33.9*  MCV 88.4 89.4  PLT 251 223    Basic Metabolic Panel: Recent Labs  Lab 11/21/20 1608 11/21/20 1830 11/22/20 0529  NA 135  --  140  K 4.0  --  4.4  CL 97*  --  104  CO2 27  --  30  GLUCOSE 191*  --  139*  BUN 16  --  11  CREATININE 0.87  --  0.86  CALCIUM 9.6  --  9.2  MG  --  1.9 1.6*    GFR: Estimated Creatinine Clearance: 70.5 mL/min (by C-G formula based on SCr of 0.86 mg/dL).  Liver Function Tests: Recent Labs  Lab 11/22/20 0529  AST 23  ALT 19  ALKPHOS 72  BILITOT 1.1  PROT 6.6  ALBUMIN 3.7     Coagulation Profile: Recent Labs  Lab 11/21/20 1830  INR 1.0     CBG: Recent Labs  Lab 11/21/20 2316 11/22/20 0529  GLUCAP 120* 132*     Thyroid Function Tests: Recent Labs    11/22/20 0529  TSH 2.293     Recent Results (from the past 240 hour(s))  Resp Panel by RT-PCR (Flu A&B, Covid)  Nasopharyngeal Swab     Status: None   Collection Time: 11/21/20  5:30 PM   Specimen: Nasopharyngeal Swab; Nasopharyngeal(NP) swabs in vial transport medium  Result Value Ref Range Status   SARS Coronavirus 2 by RT PCR NEGATIVE NEGATIVE Final    Comment: (NOTE) SARS-CoV-2 target nucleic acids are NOT DETECTED.  The SARS-CoV-2 RNA is generally detectable in upper respiratory specimens during  the acute phase of infection. The lowest concentration of SARS-CoV-2 viral copies this assay can detect is 138 copies/mL. A negative result does not preclude SARS-Cov-2 infection and should not be used as the sole basis for treatment or other patient management decisions. A negative result may occur with  improper specimen collection/handling, submission of specimen other than nasopharyngeal swab, presence of viral mutation(s) within the areas targeted by this assay, and inadequate number of viral copies(<138 copies/mL). A negative result must be combined with clinical observations, patient history, and epidemiological information. The expected result is Negative.  Fact Sheet for Patients:  BloggerCourse.comhttps://www.fda.gov/media/152166/download  Fact Sheet for Healthcare Providers:  SeriousBroker.ithttps://www.fda.gov/media/152162/download  This test is no t yet approved or cleared by the [place] FDA and  has been authorized for detection and/or diagnosis of SARS-CoV-2 by FDA under an Emergency Use Authorization (EUA). This EUA will remain  in effect (meaning this test can be used) for the duration of the COVID-19 declaration under Section 564(b)(1) of the Act, 21 U.S.C.section 360bbb-3(b)(1), unless the authorization is terminated  or revoked sooner.       Influenza A by PCR NEGATIVE NEGATIVE Final   Influenza B by PCR NEGATIVE NEGATIVE Final    Comment: (NOTE) The Xpert Xpress SARS-CoV-2/FLU/RSV plus assay is intended as an aid in the diagnosis of influenza from Nasopharyngeal swab specimens and should not be  used as a sole basis for treatment. Nasal washings and aspirates are unacceptable for Xpert Xpress SARS-CoV-2/FLU/RSV testing.  Fact Sheet for Patients: BloggerCourse.comhttps://www.fda.gov/media/152166/download  Fact Sheet for Healthcare Providers: SeriousBroker.ithttps://www.fda.gov/media/152162/download  This test is not yet approved or cleared by the [place] FDA and has been authorized for detection and/or diagnosis of SARS-CoV-2 by FDA under an Emergency Use Authorization (EUA). This EUA will remain in effect (meaning this test can be used) for the duration of the COVID-19 declaration under Section 564(b)(1) of the Act, 21 U.S.C. section 360bbb-3(b)(1), unless the authorization is terminated or revoked.  Performed at [facility], [address] [place], [place] [address]   Blood Culture (routine x 2)     Status: None (Preliminary result)   Collection Time: 11/21/20  6:30 PM   Specimen: BLOOD  Result Value Ref Range Status   Specimen Description   Final    BLOOD RIGHT ANTECUBITAL Performed at [facility], [address] [place], [place] [address]    Special Requests   Final    BOTTLES DRAWN AEROBIC AND ANAEROBIC Blood Culture adequate volume Performed at [facility], [address] [place], [place] [address]    Culture   Final    NO GROWTH < 12 HOURS Performed at [facility], [address] [place], [place] [address]    Report Status PENDING  Incomplete  Blood Culture (routine x 2)     Status: None (Preliminary result)   Collection Time: 11/21/20  6:34 PM   Specimen: BLOOD RIGHT HAND  Result Value Ref Range Status   Specimen Description   Final    BLOOD RIGHT HAND Performed at [facility], [address] [place], [place] [address]    Special Requests   Final    BOTTLES DRAWN AEROBIC AND ANAEROBIC Blood Culture adequate volume Performed at [facility], [address]  [place], [place] [address]    Culture   Final    NO GROWTH < 12 HOURS Performed at [facility], [address] [place], [place] [address]  Report Status PENDING  Incomplete      Radiology Studies: DG Chest Portable 1 View  Result Date: 11/21/2020 CLINICAL DATA:  Weakness EXAM: PORTABLE CHEST 1 VIEW COMPARISON:  None. FINDINGS: The heart size and mediastinal contours are within normal limits. No focal airspace disease. No pleural effusion or pneumothorax. No acute osseous abnormality. IMPRESSION: No evidence of acute cardiopulmonary disease. Electronically Signed   By: Caprice Renshaw   On: 11/21/2020 18:06       LOS: 1 day   Osvaldo Shipper  [facility] Pager on [email or web address]  11/22/2020, 9:30 AM

## 2020-11-22 NOTE — TOC Initial Note (Signed)
Transition of Care [facility]) - Initial/Assessment Note    Patient Details  Name: Ryan Morales MRN: [id number] Date of Birth: 29-Sep-1945  Transition of Care [facility]) CM/SW Contact:    Lanier Clam, RN Phone Number: 11/22/2020, 2:08 PM  Clinical Narrative:  PT recc SNF-Patient w/dementia-spoke to spouse Cheryl-agree to SNF;faxed out await bed offers, & pasrr.                 Expected Discharge Plan: Skilled Nursing Facility Barriers to Discharge: Continued Medical Work up   Patient Goals and CMS Choice Patient states their goals for this hospitalization and ongoing recovery are:: go to rehab CMS Medicare.gov Compare Post Acute Care list provided to:: Patient Represenative (must comment) Elnita Maxwell spouse [phone]) Choice offered to / list presented to : Spouse  Expected Discharge Plan and Services Expected Discharge Plan: Skilled Nursing Facility   Discharge Planning Services: CM Consult Post Acute Care Choice: Skilled Nursing Facility Living arrangements for the past 2 months: Single Family Home                                      Prior Living Arrangements/Services Living arrangements for the past 2 months: Single Family Home Lives with:: Spouse Patient language and need for interpreter reviewed:: Yes Do you feel safe going back to the place where you live?: Yes      Need for Family Participation in Patient Care: No (Comment) Care giver support system in place?: Yes (comment) Current home services: DME (rw) Criminal Activity/Legal Involvement Pertinent to Current Situation/Hospitalization: No - Comment as needed  Activities of Daily Living Home Assistive Devices/Equipment: Walker (specify type), Eyeglasses, Other (Comment) (2 wheel walker and a "u step", curved tip foley due to his prostate per wife, walk in tub, shower stool) ADL Screening (condition at time of admission) Patient's cognitive ability adequate to safely complete daily activities?: No Is the patient deaf  or have difficulty hearing?: No Does the patient have difficulty seeing, even when wearing glasses/contacts?: Yes (has trouble with depth perception and perceiving what he sees with his Parkinson's) Does the patient have difficulty concentrating, remembering, or making decisions?: Yes Patient able to express need for assistance with ADLs?: Yes Does the patient have difficulty dressing or bathing?: Yes Independently performs ADLs?: No Communication: Needs assistance Is this a change from baseline?: Change from baseline, expected to last >3 days Dressing (OT): Needs assistance Is this a change from baseline?: Change from baseline, expected to last >3 days Grooming: Needs assistance Is this a change from baseline?: Pre-admission baseline Feeding: Needs assistance Is this a change from baseline?: Change from baseline, expected to last >3 days Bathing: Needs assistance Is this a change from baseline?: Pre-admission baseline Toileting: Needs assistance Is this a change from baseline?: Pre-admission baseline In/Out Bed: Needs assistance Is this a change from baseline?: Pre-admission baseline Walks in Home: Needs assistance Is this a change from baseline?: Pre-admission baseline Does the patient have difficulty walking or climbing stairs?: Yes Weakness of Legs: Both Weakness of Arms/Hands: Both  Permission Sought/Granted Permission sought to share information with : Case Manager Permission granted to share information with : Yes, Verbal Permission Granted  Share Information with NAME: Case Manager     Permission granted to share info w Relationship: Elnita Maxwell spouse [phone]     Emotional Assessment Appearance:: Appears stated age Attitude/Demeanor/Rapport: Gracious Affect (typically observed): Accepting Orientation: : Oriented to Self  Alcohol / Substance Use: Not Applicable Psych Involvement: No (comment)  Admission diagnosis:  Lower urinary tract infectious disease [N39.0] Acute  metabolic encephalopathy [G93.41] Sepsis, due to unspecified organism, unspecified whether acute organ dysfunction present [facility]) [A41.9] Patient Active Problem List   Diagnosis Date Noted   Severe sepsis (HCC) 11/22/2020   Acute lower UTI 11/22/2020   Lactic acidosis 11/22/2020   Generalized weakness 11/22/2020   Acute metabolic encephalopathy 11/21/2020   Impaired mobility and ADLs 04/02/2020   Dysphagia    Parkinson's disease with neurogenic orthostatic hypotension    Dermatochalasis of both upper eyelids 10/04/2019   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 10/04/2019   Presbyopia of both eyes 10/04/2019   Refractive amblyopia, left 10/04/2019   Pseudophakia of both eyes 10/04/2019   Vitreous floater, bilateral 10/04/2019   Acquired deformity of right foot 08/08/2019   Type 2 diabetes mellitus without complication, without long-term current use of insulin 08/08/2019   Leg cramping 12/28/2017   RLS (restless legs syndrome) 09/07/2017   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 [place] [address] Phone: [phone] Fax: [phone]  [id number] [place], [place] - [address] AT [facility] [address] [place] [address] Phone: [phone] Fax: [phone]     Social Determinants of Health (SDOH) Interventions    Readmission Risk Interventions No flowsheet data found.

## 2020-11-23 ENCOUNTER — Inpatient Hospital Stay (HOSPITAL_COMMUNITY): Payer: Medicare (Managed Care)

## 2020-11-23 LAB — BASIC METABOLIC PANEL
Anion gap: 7 (ref 5–15)
BUN: 10 mg/dL (ref 8–23)
CO2: 31 mmol/L (ref 22–32)
Calcium: 9.2 mg/dL (ref 8.9–10.3)
Chloride: 101 mmol/L (ref 98–111)
Creatinine, Ser: 0.75 mg/dL (ref 0.61–1.24)
GFR, Estimated: >60 mL/min (ref >60)
Glucose, Bld: 124 mg/dL — ABNORMAL HIGH (ref 70–99)
Potassium: 4.2 mmol/L (ref 3.5–5.1)
Sodium: 139 mmol/L (ref 135–145)

## 2020-11-23 LAB — CBC
HCT: 36.7 % — ABNORMAL LOW (ref 39.0–52.0)
Hemoglobin: 12 g/dL — ABNORMAL LOW (ref 13.0–17.0)
MCH: 29.2 pg (ref 26.0–34.0)
MCHC: 32.7 g/dL (ref 30.0–36.0)
MCV: 89.3 fL (ref 80.0–100.0)
Platelets: 235 10*3/uL (ref 150–400)
RBC: 4.11 MIL/uL — ABNORMAL LOW (ref 4.22–5.81)
RDW: 12.5 % (ref 11.5–15.5)
WBC: 7.3 10*3/uL (ref 4.0–10.5)
nRBC: 0 % (ref 0.0–0.2)

## 2020-11-23 LAB — GLUCOSE, CAPILLARY
Glucose-Capillary: 115 mg/dL — ABNORMAL HIGH (ref 70–99)
Glucose-Capillary: 279 mg/dL — ABNORMAL HIGH (ref 70–99)
Glucose-Capillary: 104 mg/dL — ABNORMAL HIGH (ref 70–99)

## 2020-11-23 LAB — URINE CULTURE: Culture: NO GROWTH

## 2020-11-23 NOTE — NC FL2 (Signed)
[place] MEDICAID FL2 LEVEL OF CARE SCREENING TOOL     IDENTIFICATION  Patient Name: Ryan Morales Birthdate: 08-07-1945 Sex: male Admission Date (Current Location): 11/21/2020  [facility] and [place] Number:  Producer, television/film/video and Address:  [facility],  [address] [place], [place] [address]      Provider Number: [id number]  Attending Physician Name and Address:  Osvaldo Shipper, MD  Relative Name and Phone Number:  Maximino Cozzolino spouse [phone]    Current Level of Care: Hospital Recommended Level of Care: Skilled Nursing Facility Prior Approval Number:    Date Approved/Denied:   PASRR Number: [id number]  Discharge Plan: SNF    Current Diagnoses: Patient Active Problem List   Diagnosis Date Noted   Severe sepsis (HCC) 11/22/2020   Acute lower UTI 11/22/2020   Lactic acidosis 11/22/2020   Generalized weakness 11/22/2020   Acute metabolic encephalopathy 11/21/2020   Impaired mobility and ADLs 04/02/2020   Dysphagia    Parkinson's disease with neurogenic orthostatic hypotension    Dermatochalasis of both upper eyelids 10/04/2019   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 10/04/2019   Presbyopia of both eyes 10/04/2019   Refractive amblyopia, left 10/04/2019   Pseudophakia of both eyes 10/04/2019   Vitreous floater, bilateral 10/04/2019   Acquired deformity of right foot 08/08/2019   Type 2 diabetes mellitus without complication, without long-term current use of insulin 08/08/2019   Leg cramping 12/28/2017   RLS (restless legs syndrome) 09/07/2017   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   Obstructive sleep apnea 09/01/2016   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   Constipation 02/15/2016   Erectile dysfunction 02/15/2016   Hypertension 02/15/2016   Hyperlipidemia 02/15/2016   Convergence insufficiency 11/08/2013   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/2009     Orientation RESPIRATION BLADDER Height & Weight     Self  Normal Indwelling catheter Weight: 76 kg Height:  5\' 7"  (170.2 cm)  BEHAVIORAL SYMPTOMS/MOOD NEUROLOGICAL BOWEL NUTRITION STATUS      Incontinent Diet (Dysphagia 3-soft)  AMBULATORY STATUS COMMUNICATION OF NEEDS Skin   Limited Assist Verbally Normal                       Personal Care Assistance Level of Assistance  Bathing, Feeding, Dressing Bathing Assistance: Limited assistance Feeding assistance: Limited assistance       Functional Limitations Info  Sight, Hearing, Speech Sight Info: Impaired (eyeglasses) Hearing Info: Adequate Speech Info: Adequate    SPECIAL CARE FACTORS FREQUENCY  PT (By licensed PT), OT (By licensed OT)     PT Frequency:  (5x week) OT Frequency:  (5x week)            Contractures Contractures Info: Not present    Additional Factors Info  Code Status, Allergies Code Status Info:  (Full) Allergies Info:  (Bactrim (Sulfamethoxazole-trimethoprim)           Current Medications (11/23/2020):  This is the current hospital active medication list Current Facility-Administered Medications  Medication Dose Route Frequency Provider Last Rate Last Admin   acetaminophen (TYLENOL) tablet 650 mg  650 mg Oral Q6H PRN Howerter, Justin B, DO       Or   acetaminophen (TYLENOL) suppository 650 mg  650 mg Rectal Q6H PRN Howerter, Justin B, DO       carbidopa-levodopa (SINEMET CR) 50-200 MG per tablet controlled release 1 tablet  1 tablet Oral QHS 01/23/2021, MD  1 tablet at 11/22/20 2215   carbidopa-levodopa (SINEMET IR) 25-100 MG per tablet immediate release 1 tablet  1 tablet Oral Q24H Green, Terri L, RPH       carbidopa-levodopa (SINEMET IR) 25-100 MG per tablet immediate release 2 tablet  2 tablet Oral 3 times per day Otho Bellows, RPH       cefTRIAXone (ROCEPHIN) 1 g in sodium chloride 0.9 % 100 mL IVPB  1 g Intravenous Q24H Howerter, Justin B, DO 200 mL/hr at 11/22/20 1838 1 g  at 11/22/20 1838   feeding supplement (ENSURE ENLIVE / ENSURE PLUS) liquid 237 mL  237 mL Oral BID BM Osvaldo Shipper, MD   237 mL at 11/22/20 2013   finasteride (PROSCAR) tablet 5 mg  5 mg Oral Daily Howerter, Justin B, DO   5 mg at 11/22/20 0940   insulin aspart (novoLOG) injection 0-6 Units  0-6 Units Subcutaneous Q6H Howerter, Justin B, DO   1 Units at 11/23/20 0057   memantine (NAMENDA) tablet 5 mg  5 mg Oral BID Howerter, Justin B, DO   5 mg at 11/22/20 2215   midodrine (PROAMATINE) tablet 10 mg  10 mg Oral Daily Howerter, Justin B, DO       multivitamin with minerals tablet 1 tablet  1 tablet Oral Daily Osvaldo Shipper, MD       rivastigmine (EXELON) 9.5 mg/24hr 9.5 mg  9.5 mg Transdermal Daily Howerter, Justin B, DO         Discharge Medications: Please see discharge summary for a list of discharge medications.  Relevant Imaging Results:  Relevant Lab Results:   Additional Information ss#[id number]  Christie Viscomi, Olegario Messier, [place]

## 2020-11-23 NOTE — Care Management Important Message (Signed)
Important Message  Patient Details IM Letter given to the Patient. Name: Ryan Morales MRN: [id number] Date of Birth: Dec 10, 1945   Medicare Important Message Given:  Yes     [name] 11/23/2020, 12:01 PM

## 2020-11-23 NOTE — TOC Progression Note (Addendum)
Transition of Care [facility]) - Progression Note    Patient Details  Name: Ryan Morales MRN: [id number] Date of Birth: September 06, 1945  Transition of Care [facility]) CM/SW Contact  Hatcher Froning, Olegario Messier, RN Phone Number: 11/23/2020, 1:38 PM  Clinical Narrative:   Bed offers given to spouse, await choice  prior auth. Not medically stable.Will need covid within 24-48hrs of d/c. 4p-initiated auth with [employer] tel#[phone]/faxed w/confirmation to [phone]-H&P,PT eval,MD note-ref ID#[id number]-await auth for [facility] medically stable today.  1. 1.3 mi [facility] and [address] [place], [place] [address] [phone] Overall rating Above average 2. 1.6 mi [address] at [place], [place] [address] [place], [place] [address] [phone] Overall rating Much below average 3. 2.1 mi [facility] and [facility] [address] [place], [place] [address] [phone] Overall rating Much below average 4. 2.5 mi [facility] [place], [facility] [address] [place], [place] [address] [phone] Overall rating Average 5. 2.8 mi [facility] at the [facility] [address] [place], [place] [address] [phone] Overall rating Above average 6. 2.8 mi [facility] and [facility] [address] [place], [place] [address] [phone] Overall rating Average 7. 3 mi [facility] [address] [place], [place] [address] [phone] Overall rating Much above average 8. 3.6 mi [facility] [address] [place], [place] [address] [phone] Overall rating Average 9. 3.6 mi [facility] [address] [place], [place] [address] [phone] Overall rating Below average 10. 3.9 mi [facility] [address] [place], [place] [address] [phone] Overall rating Below average 11. 4.4 mi [facility] [employer] [address] [place], [place] [address] [phone] Overall rating Much above average 12. 4.6 mi [facility] [address] [place], [place] [address] [phone] Overall rating Above average 13. 5.5 mi [facility] [address] [place], [place] [address] [phone] Overall rating Above average 14. 8.2 [facility] [address] [place], [place] [address] [phone] Overall rating Above average 15. 9 mi [facility] [address] [place], [place] [address] [phone] Overall rating Average 16. 9.1 mi [facility] [address] [place], [place] [address] [phone] Overall rating Below average 17. 9.2 mi [facility] [address] [place], [place] [address] [phone] Overall rating Much above average 18. 10.8 mi [place] [facility] [address] [place], [place] [address] [phone] Overall rating Much above average 19. 12.6 mi [facility] [address] [place], [place] [address] [phone] Overall rating Much below average 20. 12.8 [facility] [address] [place], [place] [address] [phone] Overall rating Much below average 21. 14.2 mi [facility] [address] [place], [place] [address] [phone] Overall rating Much below average 22. 14.4 mi [facility] [address] [place], [place] [address] [phone] Overall rating Below average 23. 14.8 mi [facility] [address], [place] [address] [phone] Overall rating Above average 24. 14.9 mi [facility] and [facility] [address] [place], [place] [address] [phone] Overall rating Much below average 25. 16.5 mi [facility] [address] [place], [place] [address] [phone]  Overall rating Above average 26. 16.7 mi [facility] [address] [place], [place] [address] [phone] Overall rating Much above average 27. 17.9 mi [facility] [place] [address] [place], [place] [address] [phone] Overall rating Average 28. 18.1 [facility] [address] [place], [place] [address] [phone] Overall rating Much below average 29. 19.7 mi [facility] [address] [place], [place] [address] [phone] Overall rating Much below average 30. 20 [address] at [employer] at [facility], [place] [address] [phone] Overall rating Much above average 31. 21.1 mi [facility] [address] [place], [place] [address] [phone] Overall rating Much below average 32. 21.6 [address] [place], [place] [address] [phone] Overall rating Below average 33. 21.6 [facility] [address] [facility] [place], [place] [address] [phone] Overall rating Below average 34. 21.8 [facility] [address] [place], [place] [address] [phone] Overall rating Much above average 35. [address] [place], [place] [address] [phone] Overall rating Much above average 36. 22.6 mi [facility] [address] [place], [place] [address] [phone] Overall rating Average 37. 22.7 mi [facility] [address] [place], [place] [address] [phone] Overall rating Much below average 38. 23.3 mi [facility] [address] [place], [place] [address] [phone] Overall rating Average 39. 23.5 [facility] [address] [place], [place] [address] [phone] Overall rating Much below average 40. 24.1 mi [facility] [address] [place], [place] [address] [phone] Overall rating Below average 41. 24.2 mi [facility] and [facility] [address] [place], [place] [address] [phone] Overall rating Below average 42. 24.4 [facility] [address] [place], [place] [address] [phone] Overall rating Much below average 43. 24.5 mi [facility] [address] [place], [place] [address] [phone] Overall rating Average To explore and download nursing home data,visit   Expected Discharge Plan: Skilled Nursing Facility Barriers to Discharge: Continued Medical Work up  Expected Discharge Plan and Services Expected Discharge Plan: Skilled Nursing Facility   Discharge Planning Services: CM Consult Post Acute Care Choice: Skilled Nursing Facility Living arrangements for the past 2 months: Single Family Home                                       Social Determinants of Health (SDOH) Interventions    Readmission Risk Interventions No flowsheet data found.

## 2020-11-23 NOTE — Progress Notes (Signed)
Modified Barium Swallow Progress Note  Patient Details  Name: Ryan Morales MRN: [id number] Date of Birth: 1945-12-26  Today's Date: 11/23/2020  Modified Barium Swallow completed.  Full report located under Chart Review in the Imaging Section.  Brief recommendations include the following:  Clinical Impression  Pt with mild oral dysphagia due to his Parkinson's impacting initiation/coordination.  Trace aspiration x1 with initial bolus of thin and with sequential swallows of thin.  Initial aspiration produced reflexive cough but no further episodes.  Small single sips prevents aspiration and penetration.  Pharyngeal swallow is strong without retention.  Pt swallowed barium tablet with pudding well and advised he would like to continue this technique for medication administration.       SLP phoned RN to determine if pt received his Parkinson's medications today before testing, to which she advised he had.   Swallow Evaluation Recommendations       SLP Diet Recommendations: Regular solids;Thin liquid   Liquid Administration via: Straw   Medication Administration: Whole meds with puree, start and follow with liquids   Supervision: Patient able to self feed   Compensations: Slow rate;Small sips/bites       Oral Care Recommendations: Oral care BID       [name], MS [facility] SLP Acute Rehab Services Office [phone] Pager [phone]  [name] 11/23/2020,1:16 PM

## 2020-11-23 NOTE — Progress Notes (Signed)
   Patient Details Name: Ryan Morales MRN: [id number] DOB: 1946/03/11  MBS completed, Full report to follow.  Preliminary results as follows:  pt with mild oral dysphagia due to his Parkinson's impacting initiation/coordination.  Trace aspiration x1 with initial bolus of thin and with sequential swallows of thin.  Initial aspiration produced reflexive cough but no further episodes.  Small single sips prevents aspiration and penetration.  Pharyngeal swallow is strong without retention.  Pt swallowed barium tablet with pudding well and advised he would like to continue this technique for medication administration.    SLP phoned RN to determine if pt received his Parkinson's medications today before testing, to which she advised he had.    [name], MS [facility] SLP Acute Rehab Services Office [phone] Pager [phone]   [name] 11/23/2020, 12:34 PM

## 2020-11-23 NOTE — Progress Notes (Addendum)
[facility] PROGRESS NOTE   [name] EHU:[id number] DOB: 1945-07-28 DOA: 11/21/2020  PCP: [name], MD  Brief History/Interval Summary: 75 y.o. male with medical history significant for Parkinson's Disease, chronic indwelling foley catheter in setting of BPH, DM2, OSA on QHS CPAP, HLD, who was admitted to [facility] on 11/21/2020 with suspected acute metabolic encephalopathy in setting of severe sepsis due to UTI after presenting from home to [facility] ED for eval of altered mental status.  Reason for Visit: Acute metabolic encephalopathy.  Urinary tract infection  Consultants: None  Procedures: None  Antibiotics: Anti-infectives (From admission, onward)    Start     Dose/Rate Route Frequency Ordered Stop   11/22/20 1800  cefTRIAXone (ROCEPHIN) 1 g in sodium chloride 0.9 % 100 mL IVPB        1 g 200 mL/hr over 30 Minutes Intravenous Every 24 hours 11/21/20 1847     11/21/20 1745  cefTRIAXone (ROCEPHIN) 1 g in sodium chloride 0.9 % 100 mL IVPB        1 g 200 mL/hr over 30 Minutes Intravenous  Once 11/21/20 1740 11/21/20 1829   11/21/20 1645  cefTRIAXone (ROCEPHIN) 1 g in sodium chloride 0.9 % 100 mL IVPB  Status:  Discontinued        1 g 200 mL/hr over 30 Minutes Intravenous  Once 11/21/20 1633 11/21/20 1635       Subjective/Interval History: Patient seen this afternoon since he was in radiology most of the morning.  Remains confused and distracted.  Limited history is available from him.     Assessment/Plan:  Acute metabolic encephalopathy Family members noted progressive confusion over the last week or so prior to admission.  Thought to be secondary to urinary tract infection.  No other etiology has been found.  No obvious focal neurological deficits noted.  Seizures thought to be less likely. TSH noted to be normal at 2.29.  Urine drug screen was unremarkable.  Mentation is stable Perhaps slightly better compared to yesterday.  Continue to monitor.    Urinary tract infection in the setting of chronic indwelling Foley catheter with severe sepsis Lactic acid level level was noted to be elevated and has become normal.  Patient noted to be on ceftriaxone.  There could be an element of colonization as well. Cultures without any growth so far.  Continue ceftriaxone for another day and then transition to oral tomorrow. Catheter was last changed at his urologist office in [facility] in mid May.  Plan was to replace it this coming Monday.  Discussed with Dr. Ronne Binning with urology who will replace it while he is in the hospital. Sepsis physiology has improved.  WBC is normal today.  Generalized weakness Likely in the setting of acute infection.  No obvious focal neurological deficits noted.  PT and OT evaluation.  SNF is recommended for rehab.  History of Parkinson's disease Noted to be on carbidopa/levodopa.  Also on rivastigmine.  These are being continued.  Diabetes mellitus type 2 without complication On metformin at home.  Monitor CBGs.  History of obstructive sleep apnea CPAP at nighttime.  Hyperlipidemia On atorvastatin at home.    DVT Prophylaxis: SCDs Code Status: Full code Family Communication: Wife was updated yesterday. Disposition Plan: Hopefully return home when improved  Status is: Inpatient  Remains inpatient appropriate because:Altered mental status and IV treatments appropriate due to intensity of illness or inability to take PO  Dispo: The patient is from: Home  Anticipated d/c is to: Home              Patient currently is not medically stable to d/c.   Difficult to place patient No      Medications: Scheduled:  carbidopa-levodopa  1 tablet Oral QHS   carbidopa-levodopa  1 tablet Oral Q24H   carbidopa-levodopa  2 tablet Oral 3 times per day   feeding supplement  237 mL Oral BID BM   finasteride  5 mg Oral Daily   insulin aspart  0-6 Units Subcutaneous Q6H   memantine  5 mg Oral BID    midodrine  10 mg Oral Daily   multivitamin with minerals  1 tablet Oral Daily   rivastigmine  9.5 mg Transdermal Daily   Continuous:  cefTRIAXone (ROCEPHIN)  IV 1 g (11/22/20 1838)   ZOX:[id number]:acetaminophen **OR** acetaminophen   Objective:  Vital Signs  Vitals:   11/22/20 1414 11/22/20 2025 11/23/20 0500 11/23/20 1340  BP: 131/70 136/75 (!) 157/85 136/85  Pulse: 98 85 75 83  Resp:  17 17 18   Temp: 98.3 F (36.8 C) 98 F (36.7 C) 98.5 F (36.9 C) 97.9 F (36.6 C)  TempSrc: Oral Oral Oral Oral  SpO2: 96% 96% 96% 96%  Weight:   76 kg   Height:        Intake/Output Summary (Last 24 hours) at 11/23/2020 1505 Last data filed at 11/23/2020 0549 Gross per 24 hour  Intake 820.21 ml  Output 2200 ml  Net -1379.79 ml    Filed Weights   11/21/20 1613 11/22/20 0439 11/23/20 0500  Weight: 72.6 kg 77 kg 76 kg    General appearance: Awake alert.  In no distress.  Mildly distracted Resp: Clear to auscultation bilaterally.  Normal effort Cardio: S1-S2 is normal regular.  No S3-S4.  No rubs murmurs or bruit GI: Abdomen is soft.  Nontender nondistended.  Bowel sounds are present normal.  No masses organomegaly Extremities: No edema.  Neurologic:  No focal neurological deficits.     Lab Results:  Data Reviewed: I have personally reviewed following labs and imaging studies  CBC: Recent Labs  Lab 11/21/20 1608 11/22/20 0529 11/23/20 0453  WBC 12.2* 6.4 7.3  NEUTROABS  --  4.1  --   HGB 12.3* 11.3* 12.0*  HCT 36.6* 33.9* 36.7*  MCV 88.4 89.4 89.3  PLT 251 223 235     Basic Metabolic Panel: Recent Labs  Lab 11/21/20 1608 11/21/20 1830 11/22/20 0529 11/23/20 0453  NA 135  --  140 139  K 4.0  --  4.4 4.2  CL 97*  --  104 101  CO2 27  --  30 31  GLUCOSE 191*  --  139* 124*  BUN 16  --  11 10  CREATININE 0.87  --  0.86 0.75  CALCIUM 9.6  --  9.2 9.2  MG  --  1.9 1.6*  --      GFR: Estimated Creatinine Clearance: 75.7 mL/min (by C-G formula based on SCr of 0.75  mg/dL).  Liver Function Tests: Recent Labs  Lab 11/22/20 0529  AST 23  ALT 19  ALKPHOS 72  BILITOT 1.1  PROT 6.6  ALBUMIN 3.7      Coagulation Profile: Recent Labs  Lab 11/21/20 1830  INR 1.0      CBG: Recent Labs  Lab 11/22/20 1233 11/22/20 1824 11/22/20 2352 11/23/20 0546 11/23/20 1252  GLUCAP 124* 142* 174* 115* 279*      Thyroid Function Tests: Recent Labs  11/22/20 0529  TSH 2.293      Recent Results (from the past 240 hour(s))  Urine C&S     Status: Abnormal   Collection Time: 11/21/20  4:08 PM   Specimen: Urine, Random  Result Value Ref Range Status   Specimen Description   Final    URINE, RANDOM Performed at [facility], [address] [place], [place] [address]    Special Requests   Final    NONE Performed at [facility], [address] [place], [place] [address]    Culture MULTIPLE SPECIES PRESENT, SUGGEST RECOLLECTION (A)  Final   Report Status 11/23/2020 FINAL  Final  Resp Panel by RT-PCR (Flu A&B, Covid) Nasopharyngeal Swab     Status: None   Collection Time: 11/21/20  5:30 PM   Specimen: Nasopharyngeal Swab; Nasopharyngeal(NP) swabs in vial transport medium  Result Value Ref Range Status   SARS Coronavirus 2 by RT PCR NEGATIVE NEGATIVE Final    Comment: (NOTE) SARS-CoV-2 target nucleic acids are NOT DETECTED.  The SARS-CoV-2 RNA is generally detectable in upper respiratory specimens during the acute phase of infection. The lowest concentration of SARS-CoV-2 viral copies this assay can detect is 138 copies/mL. A negative result does not preclude SARS-Cov-2 infection and should not be used as the sole basis for treatment or other patient management decisions. A negative result may occur with  improper specimen collection/handling, submission of specimen other than nasopharyngeal swab, presence of viral mutation(s) within the areas targeted by this assay, and inadequate number of  viral copies(<138 copies/mL). A negative result must be combined with clinical observations, patient history, and epidemiological information. The expected result is Negative.  Fact Sheet for Patients:  BloggerCourse.com  Fact Sheet for Healthcare Providers:  SeriousBroker.it  This test is no t yet approved or cleared by the [place] FDA and  has been authorized for detection and/or diagnosis of SARS-CoV-2 by FDA under an Emergency Use Authorization (EUA). This EUA will remain  in effect (meaning this test can be used) for the duration of the COVID-19 declaration under Section 564(b)(1) of the Act, 21 U.S.C.section 360bbb-3(b)(1), unless the authorization is terminated  or revoked sooner.       Influenza A by PCR NEGATIVE NEGATIVE Final   Influenza B by PCR NEGATIVE NEGATIVE Final    Comment: (NOTE) The Xpert Xpress SARS-CoV-2/FLU/RSV plus assay is intended as an aid in the diagnosis of influenza from Nasopharyngeal swab specimens and should not be used as a sole basis for treatment. Nasal washings and aspirates are unacceptable for Xpert Xpress SARS-CoV-2/FLU/RSV testing.  Fact Sheet for Patients: BloggerCourse.com  Fact Sheet for Healthcare Providers: SeriousBroker.it  This test is not yet approved or cleared by the [place] FDA and has been authorized for detection and/or diagnosis of SARS-CoV-2 by FDA under an Emergency Use Authorization (EUA). This EUA will remain in effect (meaning this test can be used) for the duration of the COVID-19 declaration under Section 564(b)(1) of the Act, 21 U.S.C. section 360bbb-3(b)(1), unless the authorization is terminated or revoked.  Performed at [facility], [address] [place], [place] [address]   Urine culture     Status: None   Collection Time: 11/21/20  6:24 PM   Specimen: In/Out Cath Urine   Result Value Ref Range Status   Specimen Description   Final    IN/OUT CATH URINE Performed at [facility], [address] [place], [place] [address]  Special Requests   Final    NONE Performed at [facility], [address], [place], [place] [address]    Culture   Final    NO GROWTH Performed at [facility], [address], [place], [place] [address]    Report Status 11/23/2020 FINAL  Final  Blood Culture (routine x 2)     Status: None (Preliminary result)   Collection Time: 11/21/20  6:30 PM   Specimen: BLOOD  Result Value Ref Range Status   Specimen Description   Final    BLOOD RIGHT ANTECUBITAL Performed at [facility], [address] [place], [place] [address]    Special Requests   Final    BOTTLES DRAWN AEROBIC AND ANAEROBIC Blood Culture adequate volume Performed at [facility], [address], [place], [place] [address]    Culture   Final    NO GROWTH 2 DAYS Performed at [facility], [address], [place], [place] [address]    Report Status PENDING  Incomplete  Blood Culture (routine x 2)     Status: None (Preliminary result)   Collection Time: 11/21/20  6:34 PM   Specimen: BLOOD RIGHT HAND  Result Value Ref Range Status   Specimen Description   Final    BLOOD RIGHT HAND Performed at [facility], [address], [place], [place] [address]    Special Requests   Final    BOTTLES DRAWN AEROBIC AND ANAEROBIC Blood Culture adequate volume Performed at [facility], [address], [place], [place] [address]    Culture   Final    NO GROWTH 2 DAYS Performed at [facility], [address] [place], [place] [address]    Report Status PENDING  Incomplete       Radiology Studies: DG Chest Portable 1 View  Result Date: 11/21/2020 CLINICAL DATA:  Weakness EXAM: PORTABLE CHEST 1 VIEW COMPARISON:  None. FINDINGS: The heart  size and mediastinal contours are within normal limits. No focal airspace disease. No pleural effusion or pneumothorax. No acute osseous abnormality. IMPRESSION: No evidence of acute cardiopulmonary disease. Electronically Signed   By: Caprice Renshaw   On: 11/21/2020 18:06   DG Swallowing Func-Speech Pathology  Result Date: 11/23/2020 Formatting of this result is different from the original. Objective Swallowing Evaluation: Type of Study: Bedside Swallow Evaluation  Patient Details Name: Ryan Morales MRN: [id number] Date of Birth: 29-Sep-1945 Today's Date: 11/23/2020 Time: SLP Start Time (ACUTE ONLY): 1210 -SLP Stop Time (ACUTE ONLY): 1235 SLP Time Calculation (min) (ACUTE ONLY): 25 min Past Medical History: Past Medical History: Diagnosis Date  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  S/p TURP  Dermatochalasis of both upper eyelids 10/04/2019  Dysphagia   Hyperlipidemia   Hypertension 02/15/2016  Impaired mobility and ADLs   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 10/04/2019  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 male  who is admitted to [facility] on 11/21/2020 with suspected acute metabolic encephalopathy in setting of severe sepsis due to UTI. PMH: Parkinson's Disease, chronic indwelling foley catheter in setting of BPH, DM2, OSA, HLD  Subjective: pt awake in bed  Assessment / Plan / Recommendation CHL IP CLINICAL IMPRESSIONS 11/23/2020 Clinical Impression Pt with mild oral dysphagia due to his Parkinson's impacting initiation/coordination.  Trace aspiration x1 with initial bolus of thin and with sequential swallows of thin.  Initial aspiration produced reflexive cough but no further episodes.  Small single sips prevents aspiration and penetration.  Pharyngeal swallow is strong without retention.  Pt swallowed barium tablet with pudding well and advised he would like to continue this technique for medication administration.       SLP phoned RN to determine if pt received his Parkinson's medications today before testing, to which she advised he had. SLP Visit Diagnosis Dysphagia, oral phase (R13.11) Attention and concentration deficit following -- Frontal lobe and executive function deficit following -- Impact on safety and function Mild aspiration risk   CHL IP TREATMENT RECOMMENDATION 11/23/2020 Treatment Recommendations Therapy as outlined in treatment plan below   Prognosis 11/23/2020 Prognosis for Safe Diet Advancement Good Barriers to Reach Goals -- Barriers/Prognosis Comment -- CHL IP DIET RECOMMENDATION 11/23/2020 SLP Diet Recommendations Regular solids;Thin liquid Liquid Administration via Straw Medication Administration Whole meds with puree Compensations Slow rate;Small sips/bites Postural Changes --   CHL IP OTHER RECOMMENDATIONS 11/23/2020 Recommended Consults -- Oral Care Recommendations Oral care BID Other Recommendations --   No flowsheet data found.  CHL IP FREQUENCY AND DURATION 11/23/2020 Speech Therapy Frequency (ACUTE ONLY) min 1 x/week Treatment Duration 1 week      CHL IP ORAL PHASE 11/23/2020 Oral Phase Impaired Oral - Pudding Teaspoon -- Oral - Pudding Cup -- Oral - Honey Teaspoon -- Oral - Honey Cup -- Oral - Nectar Teaspoon -- Oral - Nectar Cup -- Oral - Nectar Straw Decreased bolus cohesion Oral - Thin Teaspoon Decreased bolus cohesion Oral - Thin Cup NT  Oral - Thin Straw Decreased bolus cohesion Oral - Puree WFL Oral - Mech Soft WFL Oral - Regular -- Oral - Multi-Consistency -- Oral - Pill WFL Oral Phase - Comment --  CHL IP PHARYNGEAL PHASE 11/23/2020 Pharyngeal Phase Impaired Pharyngeal- Pudding Teaspoon -- Pharyngeal -- Pharyngeal- Pudding Cup -- Pharyngeal -- Pharyngeal- Honey Teaspoon -- Pharyngeal -- Pharyngeal- Honey Cup -- Pharyngeal -- Pharyngeal- Nectar Teaspoon -- Pharyngeal -- Pharyngeal- Nectar Cup -- Pharyngeal -- Pharyngeal- Nectar Straw WFL Pharyngeal -- Pharyngeal- Thin Teaspoon Penetration/Aspiration before swallow Pharyngeal Material enters airway, passes BELOW cords without attempt by patient to eject out (silent aspiration) Pharyngeal- Thin Cup -- Pharyngeal -- Pharyngeal- Thin Straw Penetration/Aspiration before swallow Pharyngeal Material enters airway, CONTACTS cords and not ejected out Pharyngeal- Puree WFL Pharyngeal Material does not enter airway Pharyngeal- Mechanical Soft WFL Pharyngeal Material does not enter airway Pharyngeal- Regular -- Pharyngeal -- Pharyngeal- Multi-consistency -- Pharyngeal -- Pharyngeal- Pill WFL Pharyngeal Material does not enter airway Pharyngeal Comment --  CHL IP CERVICAL ESOPHAGEAL PHASE 11/23/2020 Cervical Esophageal Phase WFL Pudding Teaspoon -- Pudding Cup -- Honey Teaspoon -- Honey Cup -- Nectar Teaspoon -- Nectar Cup -- Nectar Straw -- Thin Teaspoon -- Thin Cup -- Thin Straw -- Puree -- Mechanical Soft -- Regular -- Multi-consistency -- Pill -- Cervical Esophageal Comment -- [name], MS [facility] SLP Acute Rehab Services Office [phone] Pager [phone] [name] 11/23/2020, 1:16 PM  LOS: 2 days   Jashua Knaak Rito Ehrlich  [facility] Pager on [email or web address]  11/23/2020, 3:05 PM

## 2020-11-23 NOTE — TOC Progression Note (Signed)
Transition of Care [facility]) - Progression Note    Patient Details  Name: Kashmir Lysaght MRN: [id number] Date of Birth: 03-18-1946  [facility] CM/SW Contact  Waldine Zenz, Olegario Messier, RN Phone Number: 11/23/2020, 9:48 AM  Clinical Narrative: Await bed offers for SNF.      Expected Discharge Plan: Skilled Nursing Facility Barriers to Discharge: Continued Medical Work up  Expected Discharge Plan and Services Expected Discharge Plan: Skilled Nursing Facility   Discharge Planning Services: CM Consult Post Acute Care Choice: Skilled Nursing Facility Living arrangements for the past 2 months: Single Family Home                                       Social Determinants of Health (SDOH) Interventions    Readmission Risk Interventions No flowsheet data found.

## 2020-11-24 LAB — BASIC METABOLIC PANEL
Anion gap: 10 (ref 5–15)
BUN: 12 mg/dL (ref 8–23)
CO2: 27 mmol/L (ref 22–32)
Calcium: 9.5 mg/dL (ref 8.9–10.3)
Chloride: 100 mmol/L (ref 98–111)
Creatinine, Ser: 0.66 mg/dL (ref 0.61–1.24)
GFR, Estimated: >60 mL/min (ref >60)
Glucose, Bld: 144 mg/dL — ABNORMAL HIGH (ref 70–99)
Potassium: 3.9 mmol/L (ref 3.5–5.1)
Sodium: 137 mmol/L (ref 135–145)

## 2020-11-24 LAB — GLUCOSE, CAPILLARY
Glucose-Capillary: 120 mg/dL — ABNORMAL HIGH (ref 70–99)
Glucose-Capillary: 133 mg/dL — ABNORMAL HIGH (ref 70–99)
Glucose-Capillary: 147 mg/dL — ABNORMAL HIGH (ref 70–99)
Glucose-Capillary: 264 mg/dL — ABNORMAL HIGH (ref 70–99)

## 2020-11-24 MED ORDER — CEPHALEXIN 500 MG PO CAPS
500.0000 mg | ORAL_CAPSULE | Freq: Three times a day (TID) | ORAL | Status: AC
  Administered 2020-11-24 – 2020-11-29 (×15): 500 mg via ORAL
  Filled 2020-11-24 (×15): qty 1

## 2020-11-24 MED ORDER — CHLORHEXIDINE GLUCONATE CLOTH 2 % EX PADS
6.0000 | MEDICATED_PAD | Freq: Every day | CUTANEOUS | Status: DC
  Administered 2020-11-24 – 2020-11-29 (×6): 6 via TOPICAL

## 2020-11-24 NOTE — Progress Notes (Signed)
[facility] PROGRESS NOTE   Ryan Morales VQM:[id number] DOB: 09-12-1945 DOA: 11/21/2020  PCP: [name], MD  Brief History/Interval Summary: 75 y.o. male with medical history significant for Parkinson's Disease, chronic indwelling foley catheter in setting of BPH, DM2, OSA on QHS CPAP, HLD, who was admitted to [facility] on 11/21/2020 with suspected acute metabolic encephalopathy in setting of severe sepsis due to UTI after presenting from home to [facility] ED for eval of altered mental status.  Reason for Visit: Acute metabolic encephalopathy.  Urinary tract infection  Consultants: None  Procedures: None  Antibiotics: Anti-infectives (From admission, onward)    Start     Dose/Rate Route Frequency Ordered Stop   11/22/20 1800  cefTRIAXone (ROCEPHIN) 1 g in sodium chloride 0.9 % 100 mL IVPB        1 g 200 mL/hr over 30 Minutes Intravenous Every 24 hours 11/21/20 1847     11/21/20 1745  cefTRIAXone (ROCEPHIN) 1 g in sodium chloride 0.9 % 100 mL IVPB        1 g 200 mL/hr over 30 Minutes Intravenous  Once 11/21/20 1740 11/21/20 1829   11/21/20 1645  cefTRIAXone (ROCEPHIN) 1 g in sodium chloride 0.9 % 100 mL IVPB  Status:  Discontinued        1 g 200 mL/hr over 30 Minutes Intravenous  Once 11/21/20 1633 11/21/20 1635       Subjective/Interval History: Patient remains distracted and confused.  Unable to provide much information.     Assessment/Plan:  Acute metabolic encephalopathy history of Parkinson's dementia Family members noted progressive confusion over the last week or so prior to admission.  Thought to be secondary to urinary tract infection.  No other etiology has been found.  No obvious focal neurological deficits noted.  Seizures thought to be less likely. TSH noted to be normal at 2.29.  Urine drug screen was unremarkable.  No significant improvement in mental status compared to yesterday.  No focal neurological deficits.   Based on patient's  neurologist note (Dr. [name]) from May 2022 patient does have Parkinson's dementia.  Had neurocognitive testing in 2021.  States possible he may be close to his baseline.  Will discuss with his spouse.  Urinary tract infection in the setting of chronic indwelling Foley catheter with severe sepsis Lactic acid level level was noted to be elevated and has become normal.  Patient noted to be on ceftriaxone.  There could be an element of colonization as well. Cultures without any growth so far.   Patient has been on ceftriaxone.  Has been afebrile.  Reasonable to transition to Keflex today. Catheter was last changed at his urologist office in [facility] in mid May.  Plan was to replace it this coming Monday.  Discussed with urology yesterday who will be changing patient's catheter during this hospitalization.  Discussed again with Dr. Mena Goes this morning.  Generalized weakness Likely in the setting of acute infection.  No obvious focal neurological deficits noted.  PT and OT evaluation.  SNF is recommended for rehab.  History of Parkinson's disease/Parkinson's dementia Noted to be on carbidopa/levodopa.  Also on rivastigmine.  These are being continued.  Diabetes mellitus type 2 without complication On metformin at home.  Monitor CBGs.  History of obstructive sleep apnea CPAP at nighttime.  Hyperlipidemia On atorvastatin at home.    DVT Prophylaxis: SCDs Code Status: Full code Family Communication: We will update wife today Disposition Plan: Needs to go to skilled nursing facility for  rehab  Status is: Inpatient  Remains inpatient appropriate because:Altered mental status and IV treatments appropriate due to intensity of illness or inability to take PO  Dispo: The patient is from: Home              Anticipated d/c is to: SNF              Patient currently is not medically stable to d/c.   Difficult to place patient No      Medications: Scheduled:  carbidopa-levodopa  1 tablet  Oral QHS   carbidopa-levodopa  1 tablet Oral Q24H   carbidopa-levodopa  2 tablet Oral 3 times per day   Chlorhexidine Gluconate Cloth  6 each Topical Daily   feeding supplement  237 mL Oral BID BM   finasteride  5 mg Oral Daily   insulin aspart  0-6 Units Subcutaneous Q6H   memantine  5 mg Oral BID   midodrine  10 mg Oral Daily   multivitamin with minerals  1 tablet Oral Daily   rivastigmine  9.5 mg Transdermal Daily   Continuous:  cefTRIAXone (ROCEPHIN)  IV 1 g (11/23/20 1823)   ZOX:[id number]:acetaminophen **OR** acetaminophen   Objective:  Vital Signs  Vitals:   11/23/20 2121 11/23/20 2247 11/24/20 0500 11/24/20 0519  BP: (!) 157/91   (!) 174/99  Pulse: 84   82  Resp: 12 18  16   Temp: 98.1 F (36.7 C)   97.6 F (36.4 C)  TempSrc:    Oral  SpO2: 95%   99%  Weight:   78.5 kg   Height:        Intake/Output Summary (Last 24 hours) at 11/24/2020 1111 Last data filed at 11/24/2020 1014 Gross per 24 hour  Intake 240 ml  Output 1150 ml  Net -910 ml    Filed Weights   11/22/20 0439 11/23/20 0500 11/24/20 0500  Weight: 77 kg 76 kg 78.5 kg    General appearance: Awake alert.  In no distress.  Mildly distracted Resp: Clear to auscultation bilaterally.  Normal effort Cardio: S1-S2 is normal regular.  No S3-S4.  No rubs murmurs or bruit GI: Abdomen is soft.  Nontender nondistended.  Bowel sounds are present normal.  No masses organomegaly Extremities: No edema.   Neurologic: No focal neurological deficits.      Lab Results:  Data Reviewed: I have personally reviewed following labs and imaging studies  CBC: Recent Labs  Lab 11/21/20 1608 11/22/20 0529 11/23/20 0453  WBC 12.2* 6.4 7.3  NEUTROABS  --  4.1  --   HGB 12.3* 11.3* 12.0*  HCT 36.6* 33.9* 36.7*  MCV 88.4 89.4 89.3  PLT 251 223 235     Basic Metabolic Panel: Recent Labs  Lab 11/21/20 1608 11/21/20 1830 11/22/20 0529 11/23/20 0453 11/24/20 0541  Ryan Morales 135  --  140 139 137  K 4.0  --  4.4 4.2 3.9  CL  97*  --  104 101 100  CO2 27  --  30 31 27   GLUCOSE 191*  --  139* 124* 144*  BUN 16  --  11 10 12   CREATININE 0.87  --  0.86 0.75 0.66  CALCIUM 9.6  --  9.2 9.2 9.5  MG  --  1.9 1.6*  --   --      GFR: Estimated Creatinine Clearance: 75.7 mL/min (by C-G formula based on SCr of 0.66 mg/dL).  Liver Function Tests: Recent Labs  Lab 11/22/20 0529  AST 23  ALT 19  ALKPHOS 72  BILITOT 1.1  PROT 6.6  ALBUMIN 3.7      Coagulation Profile: Recent Labs  Lab 11/21/20 1830  INR 1.0      CBG: Recent Labs  Lab 11/23/20 0546 11/23/20 1252 11/23/20 1809 11/24/20 0001 11/24/20 0507  GLUCAP 115* 279* 104* 133* 147*      Thyroid Function Tests: Recent Labs    11/22/20 0529  TSH 2.293      Recent Results (from the past 240 hour(s))  Urine C&S     Status: Abnormal   Collection Time: 11/21/20  4:08 PM   Specimen: Urine, Random  Result Value Ref Range Status   Specimen Description   Final    URINE, RANDOM Performed at [facility] [place], [address], [place], [place] [address]    Special Requests   Final    NONE Performed at [facility], [address], [place], [place] [address]    Culture MULTIPLE SPECIES PRESENT, SUGGEST RECOLLECTION (A)  Final   Report Status 11/23/2020 FINAL  Final  Resp Panel by RT-PCR (Flu A&B, Covid) Nasopharyngeal Swab     Status: None   Collection Time: 11/21/20  5:30 PM   Specimen: Nasopharyngeal Swab; Nasopharyngeal(NP) swabs in vial transport medium  Result Value Ref Range Status   SARS Coronavirus 2 by RT PCR NEGATIVE NEGATIVE Final    Comment: (NOTE) SARS-CoV-2 target nucleic acids are NOT DETECTED.  The SARS-CoV-2 RNA is generally detectable in upper respiratory specimens during the acute phase of infection. The lowest concentration of SARS-CoV-2 viral copies this assay can detect is 138 copies/mL. A negative result does not preclude SARS-Cov-2 infection and should not be used as the  sole basis for treatment or other patient management decisions. A negative result may occur with  improper specimen collection/handling, submission of specimen other than nasopharyngeal swab, presence of viral mutation(s) within the areas targeted by this assay, and inadequate number of viral copies(<138 copies/mL). A negative result must be combined with clinical observations, patient history, and epidemiological information. The expected result is Negative.  Fact Sheet for Patients:  BloggerCourse.com  Fact Sheet for Healthcare Providers:  SeriousBroker.it  This test is no t yet approved or cleared by the [place] FDA and  has been authorized for detection and/or diagnosis of SARS-CoV-2 by FDA under an Emergency Use Authorization (EUA). This EUA will remain  in effect (meaning this test can be used) for the duration of the COVID-19 declaration under Section 564(b)(1) of the Act, 21 U.S.C.section 360bbb-3(b)(1), unless the authorization is terminated  or revoked sooner.       Influenza A by PCR NEGATIVE NEGATIVE Final   Influenza B by PCR NEGATIVE NEGATIVE Final    Comment: (NOTE) The Xpert Xpress SARS-CoV-2/FLU/RSV plus assay is intended as an aid in the diagnosis of influenza from Nasopharyngeal swab specimens and should not be used as a sole basis for treatment. Nasal washings and aspirates are unacceptable for Xpert Xpress SARS-CoV-2/FLU/RSV testing.  Fact Sheet for Patients: BloggerCourse.com  Fact Sheet for Healthcare Providers: SeriousBroker.it  This test is not yet approved or cleared by the [place] FDA and has been authorized for detection and/or diagnosis of SARS-CoV-2 by FDA under an Emergency Use Authorization (EUA). This EUA will remain in effect (meaning this test can be used) for the duration of the COVID-19 declaration under Section 564(b)(1) of the  Act, 21 U.S.C. section 360bbb-3(b)(1), unless the authorization is terminated or revoked.  Performed at [facility], [address], [place], [place] [address]   Urine culture     Status: None   Collection Time: 11/21/20  6:24 PM   Specimen: In/Out Cath Urine  Result Value Ref Range Status   Specimen Description   Final    IN/OUT CATH URINE Performed at [facility], [address], [place], [place] [address]    Special Requests   Final    NONE Performed at [facility], [address] [place], [place] [address]    Culture   Final    NO GROWTH Performed at [facility], [address], [place], [place] [address]    Report Status 11/23/2020 FINAL  Final  Blood Culture (routine x 2)     Status: None (Preliminary result)   Collection Time: 11/21/20  6:30 PM   Specimen: BLOOD  Result Value Ref Range Status   Specimen Description   Final    BLOOD RIGHT ANTECUBITAL Performed at [facility], [address], [place], [place] [address]    Special Requests   Final    BOTTLES DRAWN AEROBIC AND ANAEROBIC Blood Culture adequate volume Performed at [facility], [address] [place], [place] [address]    Culture   Final    NO GROWTH 3 DAYS Performed at [facility], [address], [place], [place] [address]    Report Status PENDING  Incomplete  Blood Culture (routine x 2)     Status: None (Preliminary result)   Collection Time: 11/21/20  6:34 PM   Specimen: BLOOD RIGHT HAND  Result Value Ref Range Status   Specimen Description   Final    BLOOD RIGHT HAND Performed at [facility], [address] [place], [place] [address]    Special Requests   Final    BOTTLES DRAWN AEROBIC AND ANAEROBIC Blood Culture adequate volume Performed at [facility], [address] [place], [place] [address]    Culture   Final    NO GROWTH 3  DAYS Performed at [facility], [address], [place], [place] [address]    Report Status PENDING  Incomplete       Radiology Studies: DG Swallowing Func-Speech Pathology  Result Date: 11/23/2020 Formatting of this result is different from the original. Objective Swallowing Evaluation: Type of Study: Bedside Swallow Evaluation  Patient Details Name: Cleo Villamizar MRN: [id number] Date of Birth: April 29, 1946 Today's Date: 11/23/2020 Time: SLP Start Time (ACUTE ONLY): 1210 -SLP Stop Time (ACUTE ONLY): 1235 SLP Time Calculation (min) (ACUTE ONLY): 25 min Past Medical History: Past Medical History: Diagnosis Date  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  S/p TURP  Dermatochalasis of both upper eyelids 10/04/2019  Dysphagia   Hyperlipidemia   Hypertension 02/15/2016  Impaired mobility and ADLs   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 10/04/2019  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 male  who is admitted to [facility] on 11/21/2020 with suspected acute metabolic encephalopathy in setting of severe sepsis due to UTI. PMH: Parkinson's Disease, chronic indwelling foley catheter in setting of BPH, DM2, OSA, HLD  Subjective: pt awake in bed Assessment / Plan / Recommendation CHL IP CLINICAL IMPRESSIONS 11/23/2020 Clinical Impression Pt with mild oral dysphagia due to  his Parkinson's impacting initiation/coordination.  Trace aspiration x1 with initial bolus of thin and with sequential swallows of thin.  Initial aspiration produced reflexive cough but no further episodes.  Small single sips prevents aspiration and penetration.  Pharyngeal swallow is strong without retention.  Pt swallowed barium tablet with pudding well and advised he would like to continue this technique for medication administration.       SLP phoned RN to determine if pt received his Parkinson's medications today before testing, to which she advised he had. SLP Visit Diagnosis Dysphagia, oral phase (R13.11) Attention and concentration deficit following -- Frontal lobe and executive function deficit following -- Impact on safety and function Mild aspiration risk   CHL IP TREATMENT RECOMMENDATION 11/23/2020 Treatment Recommendations Therapy as outlined in treatment plan below   Prognosis 11/23/2020 Prognosis for Safe Diet Advancement Good Barriers to Reach Goals -- Barriers/Prognosis Comment -- CHL IP DIET RECOMMENDATION 11/23/2020 SLP Diet Recommendations Regular solids;Thin liquid Liquid Administration via Straw Medication Administration Whole meds with puree Compensations Slow rate;Small sips/bites Postural Changes --   CHL IP OTHER RECOMMENDATIONS 11/23/2020 Recommended Consults -- Oral Care Recommendations Oral care BID Other Recommendations --   No flowsheet data found.  CHL IP FREQUENCY AND DURATION 11/23/2020 Speech Therapy Frequency (ACUTE ONLY) min 1 x/week Treatment Duration 1 week      CHL IP ORAL PHASE 11/23/2020 Oral Phase Impaired Oral - Pudding Teaspoon -- Oral - Pudding Cup -- Oral - Honey Teaspoon -- Oral - Honey Cup -- Oral - Nectar Teaspoon -- Oral - Nectar Cup -- Oral - Nectar Straw Decreased bolus cohesion Oral - Thin Teaspoon Decreased bolus cohesion Oral - Thin Cup NT Oral - Thin Straw Decreased bolus cohesion Oral - Puree WFL Oral - Mech Soft WFL Oral - Regular -- Oral - Multi-Consistency --  Oral - Pill WFL Oral Phase - Comment --  CHL IP PHARYNGEAL PHASE 11/23/2020 Pharyngeal Phase Impaired Pharyngeal- Pudding Teaspoon -- Pharyngeal -- Pharyngeal- Pudding Cup -- Pharyngeal -- Pharyngeal- Honey Teaspoon -- Pharyngeal -- Pharyngeal- Honey Cup -- Pharyngeal -- Pharyngeal- Nectar Teaspoon -- Pharyngeal -- Pharyngeal- Nectar Cup -- Pharyngeal -- Pharyngeal- Nectar Straw WFL Pharyngeal -- Pharyngeal- Thin Teaspoon Penetration/Aspiration before swallow Pharyngeal Material enters airway, passes BELOW cords without attempt by patient to eject out (silent aspiration) Pharyngeal- Thin Cup -- Pharyngeal -- Pharyngeal- Thin Straw Penetration/Aspiration before swallow Pharyngeal Material enters airway, CONTACTS cords and not ejected out Pharyngeal- Puree WFL Pharyngeal Material does not enter airway Pharyngeal- Mechanical Soft WFL Pharyngeal Material does not enter airway Pharyngeal- Regular -- Pharyngeal -- Pharyngeal- Multi-consistency -- Pharyngeal -- Pharyngeal- Pill WFL Pharyngeal Material does not enter airway Pharyngeal Comment --  CHL IP CERVICAL ESOPHAGEAL PHASE 11/23/2020 Cervical Esophageal Phase WFL Pudding Teaspoon -- Pudding Cup -- Honey Teaspoon -- Honey Cup -- Nectar Teaspoon -- Nectar Cup -- Nectar Straw -- Thin Teaspoon -- Thin Cup -- Thin Straw -- Puree -- Mechanical Soft -- Regular -- Multi-consistency -- Pill -- Cervical Esophageal Comment -- [name], MS [facility] SLP Acute Rehab Services Office [phone] Pager [phone] [name] 11/23/2020, 1:16 PM  LOS: 3 days   Sashia Campas [employer] on [email or web address]  11/24/2020, 11:11 AM

## 2020-11-24 NOTE — Consult Note (Signed)
Urology Consult   Physician requesting consult: Honor Junes  Reason for consult: Catheter exchange  History of Present Illness: Ryan Morales is 75 y.o. male with medical history significant for Parkinson's Disease, chronic indwelling foley catheter in setting of BPH, DM2, OSA on QHS CPAP, HLD, who was admitted to [facility] on 11/21/2020 with suspected acute metabolic encephalopathy in setting of severe sepsis due to UTI after presenting from home to [facility] ED for eval of altered mental status. He follows with local urologist for scheduled coude catheter exchanges and he is overdue for a change. 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 06/16/2017   Obstructive sleep apnea 09/01/2016   Uses CPAP nightly   Parkinson's disease with neurogenic orthostatic hypotension    Presbyopia of both eyes 10/04/2019   Pseudophakia of both eyes 10/04/2019   RLS (restless legs syndrome) 09/07/2017   Type 2 diabetes mellitus without complication, without long-term current use of insulin 08/08/2019    Past Surgical History:  Procedure Laterality Date   APPENDECTOMY     BELPHAROPTOSIS REPAIR Bilateral    CATARACT EXTRACTION Bilateral    TONSILLECTOMY     TRANSURETHRAL RESECTION OF PROSTATE      Current Hospital Medications:  Home Meds:  No current facility-administered medications on file prior to encounter.   Current Outpatient Medications on File Prior to Encounter  Medication Sig Dispense Refill   atorvastatin (LIPITOR) 40 MG tablet Take 40 mg by mouth daily.     Calcium Carb-Cholecalciferol  (CALCIUM 500 + D PO) Take 500 mg by mouth daily.     carbidopa-levodopa (SINEMET CR) 50-200 MG tablet TAKE 1 TABLET AT BEDTIME (Patient taking differently: Take 1 tablet by mouth at bedtime.) 90 tablet 0   carbidopa-levodopa (SINEMET IR) 25-100 MG tablet 2 tablets at 10 AM/2 tablet at 1PM/2 tablets at 4pm/1 tablet at 7pm (Patient taking differently: Take 1-2 tablets by mouth See admin instructions. Takes 2 tablets by mouth at 10 am, 1 pm and 4 pm, then 1 tablet at 7 pm.) 630 tablet 1   escitalopram (LEXAPRO) 10 MG tablet Take 1 tablet (10 mg total) by mouth daily. 90 tablet 1   finasteride (PROSCAR) 5 MG tablet Take 5 mg by mouth daily.     gabapentin (NEURONTIN) 300 MG capsule Take 600 mg by mouth at bedtime.      ketoconazole (NIZORAL) 2 % shampoo Apply 1 application topically 2 (two) times a week.     MELATONIN PO Take 6 mg by mouth at bedtime.      memantine (NAMENDA) 5 MG tablet TAKE 1 TABLET TWICE A DAY (Patient taking differently: Take 5 mg by mouth 2 (two) times daily.) 180 tablet 0   metFORMIN (GLUCOPHAGE) 1000 MG tablet Take 1,000 mg by mouth 2 (two) times daily with a meal.     midodrine (PROAMATINE) 10 MG tablet Take 10 mg by mouth daily. Can take additional tab if systolic under 110     Multiple Vitamin (MULTIVITAMIN WITH MINERALS) TABS tablet Take 1 tablet by mouth daily.     rivastigmine (EXELON) 9.5 mg/24hr Place 9.5 mg onto  the skin daily.     NONFORMULARY OR COMPOUNDED ITEM ONE DEVICE Dispense U-step 2  DX: g20 1 each 0     Scheduled Meds:  carbidopa-levodopa  1 tablet Oral QHS   carbidopa-levodopa  1 tablet Oral Q24H   carbidopa-levodopa  2 tablet Oral 3 times per day   cephALEXin  500 mg Oral Q8H   Chlorhexidine Gluconate Cloth  6 each Topical Daily   feeding supplement  237 mL Oral BID BM   finasteride  5 mg Oral Daily   insulin aspart  0-6 Units Subcutaneous Q6H   memantine  5 mg Oral BID   midodrine  10 mg Oral Daily   multivitamin with minerals  1 tablet Oral Daily    rivastigmine  9.5 mg Transdermal Daily   Continuous Infusions: PRN Meds:.acetaminophen **OR** acetaminophen  Allergies:  Allergies  Allergen Reactions   Bactrim [Sulfamethoxazole-Trimethoprim] Hives    Family History  Problem Relation Age of Onset   Diabetes Mother    Congestive Heart Failure Mother    High Cholesterol Father    Prostate cancer Father    Stroke Father    Other Sister        Brain tumor    Social History:  reports that he has been smoking cigars. He has never used smokeless tobacco. He reports previous alcohol use. He reports that he does not use drugs.  ROS: A complete review of systems was performed.  All systems are negative except for pertinent findings as noted.  Physical Exam:  Vital signs in last 24 hours: Temp:  [97.6 F (36.4 C)-98.1 F (36.7 C)] 97.6 F (36.4 C) (06/11 0519) Pulse Rate:  [82-84] 82 (06/11 0519) Resp:  [12-18] 16 (06/11 0519) BP: (136-174)/(85-99) 174/99 (06/11 0519) SpO2:  [95 %-99 %] 99 % (06/11 0519) Weight:  [78.5 kg] 78.5 kg (06/11 0500) Constitutional:  Alert and oriented, No acute distress Cardiovascular: Regular rate and rhythm, No JVD Respiratory: Normal respiratory effort, Lungs clear bilaterally GI: Abdomen is soft, nontender, nondistended, no abdominal masses GU: foley in place draining clear yellow urine Lymphatic: No lymphadenopathy Neurologic: Grossly intact, no focal deficits Psychiatric: Normal mood and affect  Laboratory Data:  Recent Labs    11/21/20 1608 11/22/20 0529 11/23/20 0453  WBC 12.2* 6.4 7.3  HGB 12.3* 11.3* 12.0*  HCT 36.6* 33.9* 36.7*  PLT 251 223 235    Recent Labs    11/21/20 1608 11/22/20 0529 11/23/20 0453 11/24/20 0541  NA 135 140 139 137  K 4.0 4.4 4.2 3.9  CL 97* 104 101 100  GLUCOSE 191* 139* 124* 144*  BUN CALCIUM 9.6 9.2 9.2 9.5  CREATININE 0.87 0.86 0.75 0.66     Results for orders placed or performed during the hospital encounter of 11/21/20  (from the past 24 hour(s))  Glucose, capillary     Status: Abnormal   Collection Time: 11/23/20 12:52 PM  Result Value Ref Range   Glucose-Capillary 279 (H) 70 - 99 mg/dL  Glucose, capillary     Status: Abnormal   Collection Time: 11/23/20  6:09 PM  Result Value Ref Range   Glucose-Capillary 104 (H) 70 - 99 mg/dL  Glucose, capillary     Status: Abnormal   Collection Time: 11/24/20 12:01 AM  Result Value Ref Range   Glucose-Capillary 133 (H) 70 - 99 mg/dL  Glucose, capillary     Status: Abnormal   Collection Time: 11/24/20  5:07 AM  Result Value Ref Range  Glucose-Capillary 147 (H) 70 - 99 mg/dL  Basic metabolic panel     Status: Abnormal   Collection Time: 11/24/20  5:41 AM  Result Value Ref Range   Sodium 137 135 - 145 mmol/L   Potassium 3.9 3.5 - 5.1 mmol/L   Chloride 100 98 - 111 mmol/L   CO2 27 22 - 32 mmol/L   Glucose, Bld 144 (H) 70 - 99 mg/dL   BUN 12 8 - 23 mg/dL   Creatinine, Ser 9.92 0.61 - 1.24 mg/dL   Calcium 9.5 8.9 - 42.6 mg/dL   GFR, Estimated >83 >41 mL/min   Anion gap 10 5 - 15  Glucose, capillary     Status: Abnormal   Collection Time: 11/24/20 12:10 PM  Result Value Ref Range   Glucose-Capillary 264 (H) 70 - 99 mg/dL   Recent Results (from the past 240 hour(s))  Urine C&S     Status: Abnormal   Collection Time: 11/21/20  4:08 PM   Specimen: Urine, Random  Result Value Ref Range Status   Specimen Description   Final    URINE, RANDOM Performed at [facility] [place], [address], [place], [place] [address]    Special Requests   Final    NONE Performed at [facility], [address], [place], [place] [address]    Culture MULTIPLE SPECIES PRESENT, SUGGEST RECOLLECTION (A)  Final   Report Status 11/23/2020 FINAL  Final  Resp Panel by RT-PCR (Flu A&B, Covid) Nasopharyngeal Swab     Status: None   Collection Time: 11/21/20  5:30 PM   Specimen: Nasopharyngeal Swab; Nasopharyngeal(NP) swabs in vial transport medium   Result Value Ref Range Status   SARS Coronavirus 2 by RT PCR NEGATIVE NEGATIVE Final    Comment: (NOTE) SARS-CoV-2 target nucleic acids are NOT DETECTED.  The SARS-CoV-2 RNA is generally detectable in upper respiratory specimens during the acute phase of infection. The lowest concentration of SARS-CoV-2 viral copies this assay can detect is 138 copies/mL. A negative result does not preclude SARS-Cov-2 infection and should not be used as the sole basis for treatment or other patient management decisions. A negative result may occur with  improper specimen collection/handling, submission of specimen other than nasopharyngeal swab, presence of viral mutation(s) within the areas targeted by this assay, and inadequate number of viral copies(<138 copies/mL). A negative result must be combined with clinical observations, patient history, and epidemiological information. The expected result is Negative.  Fact Sheet for Patients:  BloggerCourse.com  Fact Sheet for Healthcare Providers:  SeriousBroker.it  This test is no t yet approved or cleared by the [place] FDA and  has been authorized for detection and/or diagnosis of SARS-CoV-2 by FDA under an Emergency Use Authorization (EUA). This EUA will remain  in effect (meaning this test can be used) for the duration of the COVID-19 declaration under Section 564(b)(1) of the Act, 21 U.S.C.section 360bbb-3(b)(1), unless the authorization is terminated  or revoked sooner.       Influenza A by PCR NEGATIVE NEGATIVE Final   Influenza B by PCR NEGATIVE NEGATIVE Final    Comment: (NOTE) The Xpert Xpress SARS-CoV-2/FLU/RSV plus assay is intended as an aid in the diagnosis of influenza from Nasopharyngeal swab specimens and should not be used as a sole basis for treatment. Nasal washings and aspirates are unacceptable for Xpert Xpress SARS-CoV-2/FLU/RSV testing.  Fact Sheet for  Patients: BloggerCourse.com  Fact Sheet for Healthcare Providers: SeriousBroker.it  This test is not yet approved  or cleared by the [place] and has been authorized for detection and/or diagnosis of SARS-CoV-2 by FDA under an Emergency Use Authorization (EUA). This EUA will remain in effect (meaning this test can be used) for the duration of the COVID-19 declaration under Section 564(b)(1) of the Act, 21 U.S.C. section 360bbb-3(b)(1), unless the authorization is terminated or revoked.  Performed at [facility], [address] [place], [place] [address]   Urine culture     Status: None   Collection Time: 11/21/20  6:24 PM   Specimen: In/Out Cath Urine  Result Value Ref Range Status   Specimen Description   Final    IN/OUT CATH URINE Performed at [facility], [address], [place], [place] [address]    Special Requests   Final    NONE Performed at [facility], [address] [place], [place] [address]    Culture   Final    NO GROWTH Performed at [facility], [address] [place], [place] [address]    Report Status 11/23/2020 FINAL  Final  Blood Culture (routine x 2)     Status: None (Preliminary result)   Collection Time: 11/21/20  6:30 PM   Specimen: BLOOD  Result Value Ref Range Status   Specimen Description   Final    BLOOD RIGHT ANTECUBITAL Performed at [facility], [address], [place], [place] [address]    Special Requests   Final    BOTTLES DRAWN AEROBIC AND ANAEROBIC Blood Culture adequate volume Performed at [facility], [address] [place], [place] [address]    Culture   Final    NO GROWTH 3 DAYS Performed at [facility], [address] [place], [place] [address]    Report Status PENDING  Incomplete  Blood Culture (routine x 2)     Status: None (Preliminary result)    Collection Time: 11/21/20  6:34 PM   Specimen: BLOOD RIGHT HAND  Result Value Ref Range Status   Specimen Description   Final    BLOOD RIGHT HAND Performed at [facility], [address] [place], [place] [address]    Special Requests   Final    BOTTLES DRAWN AEROBIC AND ANAEROBIC Blood Culture adequate volume Performed at [facility], [address] [place], [place] [address]    Culture   Final    NO GROWTH 3 DAYS Performed at [facility], [address] [place], [place] [address]    Report Status PENDING  Incomplete    Renal Function: Recent Labs    11/21/20 1608 11/22/20 0529 11/23/20 0453 11/24/20 0541  CREATININE 0.87 0.86 0.75 0.66   Estimated Creatinine Clearance: 75.7 mL/min (by C-G formula based on SCr of 0.66 mg/dL).  Radiologic Imaging: DG Swallowing Func-Speech Pathology  Result Date: 11/23/2020 Formatting of this result is different from the original. Objective Swallowing Evaluation: Type of Study: Bedside Swallow Evaluation  Patient Details Name: Daune Colgate MRN: [id number] Date of Birth: 1946/01/19 Today's Date: 11/23/2020 Time: SLP Start Time (ACUTE ONLY): 1210 -SLP Stop Time (ACUTE ONLY): 1235 SLP Time Calculation (min) (ACUTE ONLY): 25 min Past Medical History: Past Medical History: Diagnosis Date  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  S/p TURP  Dermatochalasis of both upper eyelids 10/04/2019  Dysphagia   Hyperlipidemia   Hypertension 02/15/2016  Impaired mobility and ADLs   Keratoconjunctivitis sicca of both eyes not specified as  Sjogren's 10/04/2019  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o. male who is admitted to [facility] on 11/21/2020 with suspected acute metabolic encephalopathy in setting of severe sepsis due to UTI. 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  Trace aspiration x1 with initial bolus of thin and with sequential swallows of thin.  Initial aspiration produced reflexive cough but no further episodes.  Small single sips prevents aspiration and penetration.  Pharyngeal swallow is strong without retention.  Pt swallowed barium tablet with pudding well and advised he would like to continue this technique for medication administration.       SLP phoned RN to determine if pt received his Parkinson's medications today before testing, to which she advised he had. SLP Visit Diagnosis Dysphagia, oral phase (R13.11) Attention and concentration deficit following -- Frontal lobe and executive function deficit following -- Impact on safety and function Mild aspiration risk   CHL IP TREATMENT RECOMMENDATION 11/23/2020 Treatment Recommendations Therapy as outlined in treatment plan below   Prognosis 11/23/2020 Prognosis for Safe Diet Advancement Good Barriers to Reach Goals -- Barriers/Prognosis Comment -- CHL IP DIET RECOMMENDATION 11/23/2020 SLP Diet Recommendations Regular solids;Thin liquid Liquid Administration via Straw Medication Administration Whole meds with puree Compensations Slow rate;Small sips/bites Postural Changes --   CHL IP OTHER  RECOMMENDATIONS 11/23/2020 Recommended Consults -- Oral Care Recommendations Oral care BID Other Recommendations --   No flowsheet data found.  CHL IP FREQUENCY AND DURATION 11/23/2020 Speech Therapy Frequency (ACUTE ONLY) min 1 x/week Treatment Duration 1 week      CHL IP ORAL PHASE 11/23/2020 Oral Phase Impaired Oral - Pudding Teaspoon -- Oral - Pudding Cup -- Oral - Honey Teaspoon -- Oral - Honey Cup -- Oral - Nectar Teaspoon -- Oral - Nectar Cup -- Oral - Nectar Straw Decreased bolus cohesion Oral - Thin Teaspoon Decreased bolus cohesion Oral - Thin Cup NT Oral - Thin Straw Decreased bolus cohesion Oral - Puree WFL Oral - Mech Soft WFL Oral - Regular -- Oral - Multi-Consistency -- Oral - Pill WFL Oral Phase - Comment --  CHL IP PHARYNGEAL PHASE 11/23/2020 Pharyngeal Phase Impaired Pharyngeal- Pudding Teaspoon -- Pharyngeal -- Pharyngeal- Pudding Cup -- Pharyngeal -- Pharyngeal- Honey Teaspoon -- Pharyngeal -- Pharyngeal- Honey Cup -- Pharyngeal -- Pharyngeal- Nectar Teaspoon -- Pharyngeal -- Pharyngeal- Nectar Cup -- Pharyngeal -- Pharyngeal- Nectar Straw WFL Pharyngeal -- Pharyngeal- Thin Teaspoon Penetration/Aspiration before swallow Pharyngeal Material enters airway, passes BELOW cords without attempt by patient to eject out (silent aspiration) Pharyngeal- Thin Cup -- Pharyngeal -- Pharyngeal- Thin Straw Penetration/Aspiration before swallow Pharyngeal Material enters airway, CONTACTS cords and not ejected out Pharyngeal- Puree WFL Pharyngeal Material does not enter airway Pharyngeal- Mechanical Soft WFL Pharyngeal Material does not enter airway Pharyngeal-  Regular -- Pharyngeal -- Pharyngeal- Multi-consistency -- Pharyngeal -- Pharyngeal- Pill WFL Pharyngeal Material does not enter airway Pharyngeal Comment --  CHL IP CERVICAL ESOPHAGEAL PHASE 11/23/2020 Cervical Esophageal Phase WFL Pudding Teaspoon -- Pudding Cup -- Honey Teaspoon -- Honey Cup -- Nectar Teaspoon -- Nectar Cup -- Nectar Straw -- Thin Teaspoon --  Thin Cup -- Thin Straw -- Puree -- Mechanical Soft -- Regular -- Multi-consistency -- Pill -- Cervical Esophageal Comment -- [name], MS [facility] SLP Acute Rehab Services Office [phone] Pager [phone] [name] 11/23/2020, 1:16 PM               I independently reviewed the above imaging studies.  Impression/Recommendation 75yo M with chronic indwelling catheter for retention 2/2 BPH, parkinsons currently hospitalized for encephalopathy, septic UTI  - catheter changed at bedside without difficulty by urology. Patient has 16Fr coude catheter in place 10cc in balloon, draining clear yellow urine - patient can continue to follow up with Dr. Logan Bores outpatient urology per previous schedule - agree with culture-directed therapy for complicated UTI for 10-14d  Kaytlyn Din 11/24/2020, 12:19 PM

## 2020-11-25 LAB — BASIC METABOLIC PANEL
Anion gap: 9 (ref 5–15)
BUN: 17 mg/dL (ref 8–23)
CO2: 27 mmol/L (ref 22–32)
Calcium: 9.4 mg/dL (ref 8.9–10.3)
Chloride: 100 mmol/L (ref 98–111)
Creatinine, Ser: 0.78 mg/dL (ref 0.61–1.24)
GFR, Estimated: >60 mL/min (ref >60)
Glucose, Bld: 191 mg/dL — ABNORMAL HIGH (ref 70–99)
Potassium: 3.9 mmol/L (ref 3.5–5.1)
Sodium: 136 mmol/L (ref 135–145)

## 2020-11-25 LAB — GLUCOSE, CAPILLARY
Glucose-Capillary: 119 mg/dL — ABNORMAL HIGH (ref 70–99)
Glucose-Capillary: 127 mg/dL — ABNORMAL HIGH (ref 70–99)
Glucose-Capillary: 161 mg/dL — ABNORMAL HIGH (ref 70–99)
Glucose-Capillary: 195 mg/dL — ABNORMAL HIGH (ref 70–99)
Glucose-Capillary: 199 mg/dL — ABNORMAL HIGH (ref 70–99)
Glucose-Capillary: 202 mg/dL — ABNORMAL HIGH (ref 70–99)

## 2020-11-25 MED ORDER — ENSURE ENLIVE PO LIQD: 237.0000 mL | Freq: Two times a day (BID) | ORAL | 12 refills | Status: AC

## 2020-11-25 MED ORDER — INSULIN ASPART 100 UNIT/ML IJ SOLN
0.0000 [IU] | Freq: Three times a day (TID) | INTRAMUSCULAR | Status: DC
  Administered 2020-11-25: 3 [IU] via SUBCUTANEOUS
  Administered 2020-11-26: 2 [IU] via SUBCUTANEOUS
  Administered 2020-11-26: 3 [IU] via SUBCUTANEOUS
  Administered 2020-11-26 – 2020-11-27 (×2): 2 [IU] via SUBCUTANEOUS
  Administered 2020-11-27 – 2020-11-28 (×2): 3 [IU] via SUBCUTANEOUS
  Administered 2020-11-28 – 2020-11-29 (×2): 2 [IU] via SUBCUTANEOUS
  Administered 2020-11-29: 3 [IU] via SUBCUTANEOUS

## 2020-11-25 MED ORDER — CEPHALEXIN 500 MG PO CAPS: 500.0000 mg | ORAL_CAPSULE | Freq: Three times a day (TID) | ORAL | 0 refills | Status: DC

## 2020-11-25 MED ORDER — INSULIN ASPART 100 UNIT/ML IJ SOLN
0.0000 [IU] | Freq: Every day | INTRAMUSCULAR | Status: DC
  Administered 2020-11-27: 2 [IU] via SUBCUTANEOUS
  Administered 2020-11-28: 3 [IU] via SUBCUTANEOUS

## 2020-11-25 NOTE — Progress Notes (Signed)
[facility] PROGRESS NOTE   Ryan Morales JXB:[id number] DOB: 1945-12-08 DOA: 11/21/2020  PCP: [name], MD  Brief History/Interval Summary: 75 y.o. male with medical history significant for Parkinson's Disease, chronic indwelling foley catheter in setting of BPH, DM2, OSA on QHS CPAP, HLD, who was admitted to [facility] on 11/21/2020 with suspected acute metabolic encephalopathy in setting of severe sepsis due to UTI after presenting from home to [facility] ED for eval of altered mental status.  Reason for Visit: Acute metabolic encephalopathy.  Urinary tract infection  Consultants: None  Procedures: None  Antibiotics: Anti-infectives (From admission, onward)    Start     Dose/Rate Route Frequency Ordered Stop   11/24/20 1400  cephALEXin (KEFLEX) capsule 500 mg        500 mg Oral Every 8 hours 11/24/20 1206     11/22/20 1800  cefTRIAXone (ROCEPHIN) 1 g in sodium chloride 0.9 % 100 mL IVPB  Status:  Discontinued        1 g 200 mL/hr over 30 Minutes Intravenous Every 24 hours 11/21/20 1847 11/24/20 1206   11/21/20 1745  cefTRIAXone (ROCEPHIN) 1 g in sodium chloride 0.9 % 100 mL IVPB        1 g 200 mL/hr over 30 Minutes Intravenous  Once 11/21/20 1740 11/21/20 1829   11/21/20 1645  cefTRIAXone (ROCEPHIN) 1 g in sodium chloride 0.9 % 100 mL IVPB  Status:  Discontinued        1 g 200 mL/hr over 30 Minutes Intravenous  Once 11/21/20 1633 11/21/20 1635       Subjective/Interval History: Patient denies any pain issues.  Remains distracted.  Mildly confused.  Pleasant.   Assessment/Plan:  Acute metabolic encephalopathy history of Parkinson's dementia Family members noted progressive confusion over the last week or so prior to admission.  Thought to be secondary to urinary tract infection.  No other etiology has been found.  No obvious focal neurological deficits noted.  Seizures thought to be less likely. TSH noted to be normal at 2.29.  Urine drug screen was  unremarkable.  Based on patient's neurologist note (Dr. [name]) from May 2022 patient does have Parkinson's dementia.  Had neurocognitive testing in 2021.   Discussed with patient's wife yesterday.  She feels that his mentation has improved.  He could be close to his baseline.    Urinary tract infection in the setting of chronic indwelling Foley catheter with severe sepsis Lactic acid level level was noted to be elevated and has become normal.  Patient noted to be on ceftriaxone.  There could be an element of colonization as well. Patient initially started on ceftriaxone.  Cultures without any growth so far.   Patient was transitioned to Keflex. Catheter was last changed at his urologist office in [facility] in mid May.  Plan was to replace it this coming Monday.   Discussed with the inpatient neurology team and they went ahead and change his catheter on 6/11.  Patient will need to follow-up with his primary urologist.  Generalized weakness Likely in the setting of acute infection.  No obvious focal neurological deficits noted.  PT and OT evaluation.  SNF is recommended for rehab.  History of Parkinson's disease/Parkinson's dementia Noted to be on carbidopa/levodopa.  Also on rivastigmine.  These are being continued.  Diabetes mellitus type 2 without complication On metformin at home.  Monitor CBGs.  CBGs noted to be running high.  May resume metformin at discharge.  History of obstructive  sleep apnea CPAP at nighttime.  Hyperlipidemia On atorvastatin at home.    DVT Prophylaxis: SCDs Code Status: Full code Family Communication: Wife was updated yesterday. Disposition Plan: Needs to go to skilled nursing facility for rehab.  Waiting on insurance authorization.  Status is: Inpatient  Remains inpatient appropriate because:Altered mental status and IV treatments appropriate due to intensity of illness or inability to take PO  Dispo: The patient is from: Home              Anticipated  d/c is to: SNF              Patient currently is medically stable to d/c.   Difficult to place patient No      Medications: Scheduled:  carbidopa-levodopa  1 tablet Oral QHS   carbidopa-levodopa  1 tablet Oral Q24H   carbidopa-levodopa  2 tablet Oral 3 times per day   cephALEXin  500 mg Oral Q8H   Chlorhexidine Gluconate Cloth  6 each Topical Daily   feeding supplement  237 mL Oral BID BM   finasteride  5 mg Oral Daily   insulin aspart  0-6 Units Subcutaneous Q6H   memantine  5 mg Oral BID   midodrine  10 mg Oral Daily   multivitamin with minerals  1 tablet Oral Daily   rivastigmine  9.5 mg Transdermal Daily   Continuous:   OMV:[id number] **OR** acetaminophen   Objective:  Vital Signs  Vitals:   11/24/20 1339 11/24/20 2022 11/25/20 0528 11/25/20 0532  BP: 125/69 139/83  124/72  Pulse: 77 90  82  Resp: 20 16  20   Temp: 98.3 F (36.8 C) 97.8 F (36.6 C)  97.6 F (36.4 C)  TempSrc: Oral Oral  Oral  SpO2: 96% 96%  98%  Weight:   76.3 kg   Height:        Intake/Output Summary (Last 24 hours) at 11/25/2020 1042 Last data filed at 11/25/2020 0535 Gross per 24 hour  Intake 720 ml  Output 900 ml  Net -180 ml    Filed Weights   11/23/20 0500 11/24/20 0500 11/25/20 0528  Weight: 76 kg 78.5 kg 76.3 kg    General appearance: Awake alert.  In no distress.  Pleasantly confused Resp: Clear to auscultation bilaterally.  Normal effort Cardio: S1-S2 is normal regular.  No S3-S4.  No rubs murmurs or bruit GI: Abdomen is soft.  Nontender nondistended.  Bowel sounds are present normal.  No masses organomegaly Extremities: No edema.   Neurologic: No focal neurological deficits.       Lab Results:  Data Reviewed: I have personally reviewed following labs and imaging studies  CBC: Recent Labs  Lab 11/21/20 1608 11/22/20 0529 11/23/20 0453  WBC 12.2* 6.4 7.3  NEUTROABS  --  4.1  --   HGB 12.3* 11.3* 12.0*  HCT 36.6* 33.9* 36.7*  MCV 88.4 89.4 89.3  PLT 251  223 235     Basic Metabolic Panel: Recent Labs  Lab 11/21/20 1608 11/21/20 1830 11/22/20 0529 11/23/20 0453 11/24/20 0541 11/25/20 0531  NA 135  --  140 139 137 136  K 4.0  --  4.4 4.2 3.9 3.9  CL 97*  --  104 101 100 100  CO2 27  --  30 31 27 27   GLUCOSE 191*  --  139* 124* 144* 191*  BUN 16  --  11 10 12 17   CREATININE 0.87  --  0.86 0.75 0.66 0.78  CALCIUM 9.6  --  9.2 9.2 9.5 9.4  MG  --  1.9 1.6*  --   --   --      GFR: Estimated Creatinine Clearance: 75.7 mL/min (by C-G formula based on SCr of 0.78 mg/dL).  Liver Function Tests: Recent Labs  Lab 11/22/20 0529  AST 23  ALT 19  ALKPHOS 72  BILITOT 1.1  PROT 6.6  ALBUMIN 3.7      Coagulation Profile: Recent Labs  Lab 11/21/20 1830  INR 1.0      CBG: Recent Labs  Lab 11/24/20 1210 11/24/20 1716 11/25/20 0005 11/25/20 0346 11/25/20 0544  GLUCAP 264* 120* 202* 161* 199*      Thyroid Function Tests: No results for input(s): TSH, T4TOTAL, FREET4, T3FREE, THYROIDAB in the last 72 hours.    Recent Results (from the past 240 hour(s))  Urine C&S     Status: Abnormal   Collection Time: 11/21/20  4:08 PM   Specimen: Urine, Random  Result Value Ref Range Status   Specimen Description   Final    URINE, RANDOM Performed at [facility], [address], [place], [place] [address]    Special Requests   Final    NONE Performed at [facility], [address], [place], [place] [address]    Culture MULTIPLE SPECIES PRESENT, SUGGEST RECOLLECTION (A)  Final   Report Status 11/23/2020 FINAL  Final  Resp Panel by RT-PCR (Flu A&B, Covid) Nasopharyngeal Swab     Status: None   Collection Time: 11/21/20  5:30 PM   Specimen: Nasopharyngeal Swab; Nasopharyngeal(NP) swabs in vial transport medium  Result Value Ref Range Status   SARS Coronavirus 2 by RT PCR NEGATIVE NEGATIVE Final    Comment: (NOTE) SARS-CoV-2 target nucleic acids are NOT DETECTED.  The SARS-CoV-2  RNA is generally detectable in upper respiratory specimens during the acute phase of infection. The lowest concentration of SARS-CoV-2 viral copies this assay can detect is 138 copies/mL. A negative result does not preclude SARS-Cov-2 infection and should not be used as the sole basis for treatment or other patient management decisions. A negative result may occur with  improper specimen collection/handling, submission of specimen other than nasopharyngeal swab, presence of viral mutation(s) within the areas targeted by this assay, and inadequate number of viral copies(<138 copies/mL). A negative result must be combined with clinical observations, patient history, and epidemiological information. The expected result is Negative.  Fact Sheet for Patients:  BloggerCourse.com  Fact Sheet for Healthcare Providers:  SeriousBroker.it  This test is no t yet approved or cleared by the [place] FDA and  has been authorized for detection and/or diagnosis of SARS-CoV-2 by FDA under an Emergency Use Authorization (EUA). This EUA will remain  in effect (meaning this test can be used) for the duration of the COVID-19 declaration under Section 564(b)(1) of the Act, 21 U.S.C.section 360bbb-3(b)(1), unless the authorization is terminated  or revoked sooner.       Influenza A by PCR NEGATIVE NEGATIVE Final   Influenza B by PCR NEGATIVE NEGATIVE Final    Comment: (NOTE) The Xpert Xpress SARS-CoV-2/FLU/RSV plus assay is intended as an aid in the diagnosis of influenza from Nasopharyngeal swab specimens and should not be used as a sole basis for treatment. Nasal washings and aspirates are unacceptable for Xpert Xpress SARS-CoV-2/FLU/RSV testing.  Fact Sheet for Patients: BloggerCourse.com  Fact Sheet for Healthcare Providers: SeriousBroker.it  This test is not yet approved or cleared by the  [place] FDA and has  been authorized for detection and/or diagnosis of SARS-CoV-2 by FDA under an Emergency Use Authorization (EUA). This EUA will remain in effect (meaning this test can be used) for the duration of the COVID-19 declaration under Section 564(b)(1) of the Act, 21 U.S.C. section 360bbb-3(b)(1), unless the authorization is terminated or revoked.  Performed at [facility], [address] [place], [place] [address]   Urine culture     Status: None   Collection Time: 11/21/20  6:24 PM   Specimen: In/Out Cath Urine  Result Value Ref Range Status   Specimen Description   Final    IN/OUT CATH URINE Performed at [facility], [address] [place], [place] [address]    Special Requests   Final    NONE Performed at [facility], [address] [place], [place] [address]    Culture   Final    NO GROWTH Performed at [facility], [address] [place], [place] [address]    Report Status 11/23/2020 FINAL  Final  Blood Culture (routine x 2)     Status: None (Preliminary result)   Collection Time: 11/21/20  6:30 PM   Specimen: BLOOD  Result Value Ref Range Status   Specimen Description   Final    BLOOD RIGHT ANTECUBITAL Performed at [facility], [address] [place], [place] [address]    Special Requests   Final    BOTTLES DRAWN AEROBIC AND ANAEROBIC Blood Culture adequate volume Performed at [facility], [address] [place], [place] [address]    Culture   Final    NO GROWTH 4 DAYS Performed at [facility], [address] [place], [place] [address]    Report Status PENDING  Incomplete  Blood Culture (routine x 2)     Status: None (Preliminary result)   Collection Time: 11/21/20  6:34 PM   Specimen: BLOOD RIGHT HAND  Result Value Ref Range Status   Specimen Description   Final    BLOOD RIGHT HAND Performed at [facility], [address] [place], [place] [address]    Special Requests   Final    BOTTLES DRAWN AEROBIC AND ANAEROBIC Blood Culture adequate volume Performed at [facility], [address] [place], [place] [address]    Culture   Final    NO GROWTH 4 DAYS Performed at [facility], [address] [place], [place] [address]    Report Status PENDING  Incomplete       Radiology Studies: DG Swallowing Func-Speech Pathology  Result Date: 11/23/2020 Formatting of this result is different from the original. Objective Swallowing Evaluation: Type of Study: Bedside Swallow Evaluation  Patient Details Name: Ryan CrankerCharles Wale MRN: [id number] Date of Birth: 11/25/1945 Today's Date: 11/23/2020 Time: SLP Start Time (ACUTE ONLY): 1210 -SLP Stop Time (ACUTE ONLY): 1235 SLP Time Calculation (min) (ACUTE ONLY): 25 min Past Medical History: Past Medical History: Diagnosis Date  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  S/p TURP  Dermatochalasis of both upper eyelids 10/04/2019  Dysphagia   Hyperlipidemia   Hypertension 02/15/2016  Impaired mobility and ADLs   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 10/04/2019  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 male who is admitted to [facility] on 11/21/2020 with suspected acute metabolic encephalopathy in setting  of severe sepsis due to UTI. 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  Trace aspiration x1 with initial bolus of thin and with sequential swallows of thin.  Initial aspiration produced reflexive cough but no further episodes.  Small single sips prevents aspiration and penetration.  Pharyngeal swallow is strong without retention.  Pt swallowed barium tablet with pudding well and advised he would like to continue this technique for medication administration.       SLP phoned RN to determine if pt received his Parkinson's medications today before testing, to which she advised he had. SLP Visit Diagnosis Dysphagia, oral phase (R13.11) Attention and concentration deficit following -- Frontal lobe and executive function deficit following -- Impact on safety and function Mild aspiration risk   CHL IP TREATMENT RECOMMENDATION 11/23/2020 Treatment Recommendations Therapy as outlined in treatment plan below   Prognosis 11/23/2020 Prognosis for Safe Diet Advancement Good Barriers to Reach Goals -- Barriers/Prognosis Comment -- CHL IP DIET RECOMMENDATION 11/23/2020 SLP Diet Recommendations Regular solids;Thin liquid Liquid Administration via Straw Medication Administration Whole meds with puree Compensations Slow rate;Small sips/bites Postural Changes --   CHL IP OTHER RECOMMENDATIONS 11/23/2020 Recommended Consults -- Oral Care Recommendations Oral care BID Other Recommendations --   No flowsheet data found.  CHL IP FREQUENCY AND DURATION 11/23/2020 Speech Therapy Frequency (ACUTE ONLY) min 1 x/week Treatment Duration 1 week      CHL IP ORAL PHASE 11/23/2020 Oral Phase Impaired Oral - Pudding Teaspoon -- Oral - Pudding Cup -- Oral - Honey Teaspoon -- Oral - Honey Cup -- Oral -  Nectar Teaspoon -- Oral - Nectar Cup -- Oral - Nectar Straw Decreased bolus cohesion Oral - Thin Teaspoon Decreased bolus cohesion Oral - Thin Cup NT Oral - Thin Straw Decreased bolus cohesion Oral - Puree WFL Oral - Mech Soft WFL Oral - Regular -- Oral - Multi-Consistency -- Oral - Pill WFL Oral Phase - Comment --  CHL IP PHARYNGEAL PHASE 11/23/2020 Pharyngeal Phase Impaired Pharyngeal- Pudding Teaspoon -- Pharyngeal -- Pharyngeal- Pudding Cup -- Pharyngeal -- Pharyngeal- Honey Teaspoon -- Pharyngeal -- Pharyngeal- Honey Cup -- Pharyngeal -- Pharyngeal- Nectar Teaspoon -- Pharyngeal -- Pharyngeal- Nectar Cup -- Pharyngeal -- Pharyngeal- Nectar Straw WFL Pharyngeal -- Pharyngeal- Thin Teaspoon Penetration/Aspiration before swallow Pharyngeal Material enters airway, passes BELOW cords without attempt by patient to eject out (silent aspiration) Pharyngeal- Thin Cup -- Pharyngeal -- Pharyngeal- Thin Straw Penetration/Aspiration before swallow Pharyngeal Material enters airway, CONTACTS cords and not ejected out Pharyngeal- Puree WFL Pharyngeal Material does not enter airway Pharyngeal- Mechanical Soft WFL Pharyngeal Material does not enter airway Pharyngeal- Regular -- Pharyngeal -- Pharyngeal- Multi-consistency -- Pharyngeal -- Pharyngeal- Pill WFL Pharyngeal Material does not enter airway Pharyngeal Comment --  CHL IP CERVICAL ESOPHAGEAL PHASE 11/23/2020 Cervical Esophageal Phase WFL Pudding Teaspoon -- Pudding Cup -- Honey Teaspoon -- Honey Cup --  Nectar Teaspoon -- Nectar Cup -- Nectar Straw -- Thin Teaspoon -- Thin Cup -- Thin Straw -- Puree -- Mechanical Soft -- Regular -- Multi-consistency -- Pill -- Cervical Esophageal Comment -- [name], MS [facility] SLP Acute Rehab Services Office [phone] Pager [phone] [name] 11/23/2020, 1:16 PM                  LOS: 4 days   Osvaldo Shipper  [facility] Pager on [email or web address]  11/25/2020, 10:42 AM

## 2020-11-25 NOTE — Discharge Summary (Signed)
[facility]  Physician Discharge Summary   Patient ID: Ryan Morales MRN: [id number] DOB/AGE: December 24, 1945 75 y.o.  Admit date: 11/21/2020 Discharge date:   11/25/2020  PCP: [name], MD  DISCHARGE DIAGNOSES:  Acute metabolic encephalopathy secondary to urinary tract infection Urinary tract infection Chronic indwelling Foley catheter Severe sepsis, resolved History of Parkinson's disease History of Parkinson's dementia Diabetes mellitus type 2 without complications History of obstructive sleep apnea Hyperlipidemia   RECOMMENDATIONS FOR OUTPATIENT FOLLOW UP: Patient needs to go to skilled nursing facility for rehab as he is physically deconditioned from his acute illness. Patient will need to see his primary urologist, Dr. Logan Bores, at [facility] in the next 2 to 3 weeks    Home Health: Going to SNF Equipment/Devices: None  CODE STATUS: Full code  DISCHARGE CONDITION: fair  Diet recommendation: Dysphagia 3 diet with thin liquids  INITIAL HISTORY: 75 y.o. male with medical history significant for Parkinson's Disease, chronic indwelling foley catheter in setting of BPH, DM2, OSA on QHS CPAP, HLD, who was admitted to [facility] on 11/21/2020 with suspected acute metabolic encephalopathy in setting of severe sepsis due to UTI after presenting from home to [facility] ED for eval of altered mental status.   HOSPITAL COURSE:   Acute metabolic encephalopathy  Family members noted progressive confusion over the last week or so prior to admission.  Thought to be secondary to urinary tract infection.  No other etiology has been found.  No obvious focal neurological deficits noted.  Seizures thought to be less likely. TSH noted to be normal at 2.29.  Urine drug screen was unremarkable. Based on patient's neurologist note (Dr. [name]) from May 2022 patient does have Parkinson's dementia.  Had neurocognitive testing in 2021.   Discussed with patient's wife.   She feels that his mentation has improved.  He could be close to his baseline.    Urinary tract infection in the setting of chronic indwelling Foley catheter with severe sepsis Lactic acid level level was noted to be elevated and has become normal.  Patient noted to be on ceftriaxone.  There could be an element of colonization as well. Patient initially started on ceftriaxone.  Cultures without any growth so far.   Patient was transitioned to Keflex. Catheter was last changed at his urologist office in [facility] in mid May.  Plan was to replace it this coming Monday.   Discussed with the inpatient neurology team and they went ahead and change his catheter on 6/11.  Patient will need to follow-up with his primary urologist.   Generalized weakness Likely in the setting of acute infection.  No obvious focal neurological deficits noted.  Seen by physical therapy. SNF is recommended for rehab.  History of Parkinson's disease/Parkinson's dementia Noted to be on carbidopa/levodopa.  Also on rivastigmine.  These are being continued.  Diabetes mellitus type 2 without complication Resume metformin at discharge.  History of obstructive sleep apnea CPAP at nighttime.  Hyperlipidemia On atorvastatin at home.    Patient is stable.  Okay for discharge to SNF when bed is available.   PERTINENT LABS:  The results of significant diagnostics from this hospitalization (including imaging, microbiology, ancillary and laboratory) are listed below for reference.    Microbiology: Recent Results (from the past 240 hour(s))  Urine C&S     Status: Abnormal   Collection Time: 11/21/20  4:08 PM   Specimen: Urine, Random  Result Value Ref Range Status   Specimen Description   Final  URINE, RANDOM Performed at [facility], [address] [place], [place] [address]    Special Requests   Final    NONE Performed at [facility], [address], [place],  [place] [address]    Culture MULTIPLE SPECIES PRESENT, SUGGEST RECOLLECTION (A)  Final   Report Status 11/23/2020 FINAL  Final  Resp Panel by RT-PCR (Flu A&B, Covid) Nasopharyngeal Swab     Status: None   Collection Time: 11/21/20  5:30 PM   Specimen: Nasopharyngeal Swab; Nasopharyngeal(NP) swabs in vial transport medium  Result Value Ref Range Status   SARS Coronavirus 2 by RT PCR NEGATIVE NEGATIVE Final    Comment: (NOTE) SARS-CoV-2 target nucleic acids are NOT DETECTED.  The SARS-CoV-2 RNA is generally detectable in upper respiratory specimens during the acute phase of infection. The lowest concentration of SARS-CoV-2 viral copies this assay can detect is 138 copies/mL. A negative result does not preclude SARS-Cov-2 infection and should not be used as the sole basis for treatment or other patient management decisions. A negative result may occur with  improper specimen collection/handling, submission of specimen other than nasopharyngeal swab, presence of viral mutation(s) within the areas targeted by this assay, and inadequate number of viral copies(<138 copies/mL). A negative result must be combined with clinical observations, patient history, and epidemiological information. The expected result is Negative.  Fact Sheet for Patients:  BloggerCourse.com  Fact Sheet for Healthcare Providers:  SeriousBroker.it  This test is no t yet approved or cleared by the [place] FDA and  has been authorized for detection and/or diagnosis of SARS-CoV-2 by FDA under an Emergency Use Authorization (EUA). This EUA will remain  in effect (meaning this test can be used) for the duration of the COVID-19 declaration under Section 564(b)(1) of the Act, 21 U.S.C.section 360bbb-3(b)(1), unless the authorization is terminated  or revoked sooner.       Influenza A by PCR NEGATIVE NEGATIVE Final   Influenza B by PCR NEGATIVE NEGATIVE Final    Comment:  (NOTE) The Xpert Xpress SARS-CoV-2/FLU/RSV plus assay is intended as an aid in the diagnosis of influenza from Nasopharyngeal swab specimens and should not be used as a sole basis for treatment. Nasal washings and aspirates are unacceptable for Xpert Xpress SARS-CoV-2/FLU/RSV testing.  Fact Sheet for Patients: BloggerCourse.com  Fact Sheet for Healthcare Providers: SeriousBroker.it  This test is not yet approved or cleared by the [place] FDA and has been authorized for detection and/or diagnosis of SARS-CoV-2 by FDA under an Emergency Use Authorization (EUA). This EUA will remain in effect (meaning this test can be used) for the duration of the COVID-19 declaration under Section 564(b)(1) of the Act, 21 U.S.C. section 360bbb-3(b)(1), unless the authorization is terminated or revoked.  Performed at [facility], [address], [place], [place] [address]   Urine culture     Status: None   Collection Time: 11/21/20  6:24 PM   Specimen: In/Out Cath Urine  Result Value Ref Range Status   Specimen Description   Final    IN/OUT CATH URINE Performed at [facility], [address], [place], [place] [address]    Special Requests   Final    NONE Performed at [facility], [address] [place], [place] [address]    Culture   Final    NO GROWTH Performed at [facility], [address], [place], [place] [address]    Report Status 11/23/2020 FINAL  Final  Blood Culture (routine x 2)     Status: None (Preliminary result)   Collection Time: 11/21/20  6:30 PM   Specimen: BLOOD  Result Value Ref Range Status   Specimen Description   Final    BLOOD RIGHT ANTECUBITAL Performed at [facility], [address] [place], [place] [address]    Special Requests   Final    BOTTLES DRAWN AEROBIC AND ANAEROBIC Blood Culture adequate volume Performed at  [facility], [address] [place], [place] [address]    Culture   Final    NO GROWTH 4 DAYS Performed at [facility], [address] [place], [place] [address]    Report Status PENDING  Incomplete  Blood Culture (routine x 2)     Status: None (Preliminary result)   Collection Time: 11/21/20  6:34 PM   Specimen: BLOOD RIGHT HAND  Result Value Ref Range Status   Specimen Description   Final    BLOOD RIGHT HAND Performed at [facility], [address] [place], [place] [address]    Special Requests   Final    BOTTLES DRAWN AEROBIC AND ANAEROBIC Blood Culture adequate volume Performed at [facility], [address] [place], [place] [address]    Culture   Final    NO GROWTH 4 DAYS Performed at [facility], [address] [place], [place] [address]    Report Status PENDING  Incomplete     Labs:  COVID-19 Labs   Lab Results  Component Value Date   SARSCOV2NAA NEGATIVE 11/21/2020      Basic Metabolic Panel: Recent Labs  Lab 11/21/20 1608 11/21/20 1830 11/22/20 0529 11/23/20 0453 11/24/20 0541 11/25/20 0531  NA 135  --  140 139 137 136  K 4.0  --  4.4 4.2 3.9 3.9  CL 97*  --  104 101 100 100  CO2 27  --  30 31 27 27   GLUCOSE 191*  --  139* 124* 144* 191*  BUN 16  --  11 10 12 17   CREATININE 0.87  --  0.86 0.75 0.66 0.78  CALCIUM 9.6  --  9.2 9.2 9.5 9.4  MG  --  1.9 1.6*  --   --   --    Liver Function Tests: Recent Labs  Lab 11/22/20 0529  AST 23  ALT 19  ALKPHOS 72  BILITOT 1.1  PROT 6.6  ALBUMIN 3.7    CBC: Recent Labs  Lab 11/21/20 1608 11/22/20 0529 11/23/20 0453  WBC 12.2* 6.4 7.3  NEUTROABS  --  4.1  --   HGB 12.3* 11.3* 12.0*  HCT 36.6* 33.9* 36.7*  MCV 88.4 89.4 89.3  PLT 251 223 235     CBG: Recent Labs  Lab 11/24/20 1210 11/24/20 1716 11/25/20 0005 11/25/20 0346 11/25/20 0544  GLUCAP 264* 120* 202* 161* 199*     IMAGING STUDIES DG Chest  Portable 1 View  Result Date: 11/21/2020 CLINICAL DATA:  Weakness EXAM: PORTABLE CHEST 1 VIEW COMPARISON:  None. FINDINGS: The heart size and mediastinal contours are within normal limits. No focal airspace disease. No pleural effusion or pneumothorax. No acute osseous abnormality. IMPRESSION: No evidence of acute cardiopulmonary disease. Electronically Signed   By: Caprice RenshawJacob  Kahn   On: 11/21/2020 18:06   DG Swallowing Func-Speech Pathology  Result Date: 11/23/2020 Formatting of this result is different from the original. Objective Swallowing Evaluation: Type of Study: Bedside Swallow Evaluation  Patient Details Name: Ryan Morales MRN: [id number] Date of Birth: August 27, 1945 Today's Date: 11/23/2020 Time: SLP Start Time (ACUTE ONLY): 1210 -SLP Stop Time (ACUTE ONLY): 1235 SLP Time Calculation (min) (ACUTE ONLY): 25 min Past Medical History: Past Medical History: Diagnosis Date  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  S/p TURP  Dermatochalasis of both upper eyelids 10/04/2019  Dysphagia   Hyperlipidemia   Hypertension 02/15/2016  Impaired mobility and ADLs   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 10/04/2019  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 male who is admitted to [facility] on 11/21/2020 with suspected acute metabolic encephalopathy in setting of severe sepsis due to  UTI. 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  Trace aspiration x1 with initial bolus of thin and with sequential swallows of thin.  Initial aspiration produced reflexive cough but no further episodes.  Small single sips prevents aspiration and penetration.  Pharyngeal swallow is strong without retention.  Pt swallowed barium tablet with pudding well and advised he would like to continue this technique for medication administration.       SLP phoned RN to determine if pt received his Parkinson's medications today before testing, to which she advised he had. SLP Visit Diagnosis Dysphagia, oral phase (R13.11) Attention and concentration deficit following -- Frontal lobe and executive function deficit following -- Impact on safety and function Mild aspiration risk   CHL IP TREATMENT RECOMMENDATION 11/23/2020 Treatment Recommendations Therapy as outlined in treatment plan below   Prognosis 11/23/2020 Prognosis for Safe Diet Advancement Good Barriers to Reach Goals -- Barriers/Prognosis Comment -- CHL IP DIET RECOMMENDATION 11/23/2020 SLP Diet Recommendations Regular solids;Thin liquid Liquid Administration via Straw Medication Administration Whole meds with puree Compensations Slow rate;Small sips/bites Postural Changes --   CHL IP OTHER RECOMMENDATIONS 11/23/2020 Recommended Consults -- Oral Care Recommendations Oral care BID Other Recommendations --   No flowsheet data found.  CHL IP FREQUENCY AND DURATION 11/23/2020 Speech Therapy Frequency (ACUTE ONLY) min 1 x/week Treatment Duration 1 week      CHL IP ORAL PHASE 11/23/2020 Oral Phase Impaired Oral - Pudding Teaspoon -- Oral - Pudding Cup -- Oral - Honey Teaspoon -- Oral - Honey Cup -- Oral - Nectar Teaspoon -- Oral  - Nectar Cup -- Oral - Nectar Straw Decreased bolus cohesion Oral - Thin Teaspoon Decreased bolus cohesion Oral - Thin Cup NT Oral - Thin Straw Decreased bolus cohesion Oral - Puree WFL Oral - Mech Soft WFL Oral - Regular -- Oral - Multi-Consistency -- Oral - Pill WFL Oral Phase - Comment --  CHL IP PHARYNGEAL PHASE 11/23/2020 Pharyngeal Phase Impaired Pharyngeal- Pudding Teaspoon -- Pharyngeal -- Pharyngeal- Pudding Cup -- Pharyngeal -- Pharyngeal- Honey Teaspoon -- Pharyngeal -- Pharyngeal- Honey Cup --  Pharyngeal -- Pharyngeal- Nectar Teaspoon -- Pharyngeal -- Pharyngeal- Nectar Cup -- Pharyngeal -- Pharyngeal- Nectar Straw WFL Pharyngeal -- Pharyngeal- Thin Teaspoon Penetration/Aspiration before swallow Pharyngeal Material enters airway, passes BELOW cords without attempt by patient to eject out (silent aspiration) Pharyngeal- Thin Cup -- Pharyngeal -- Pharyngeal- Thin Straw Penetration/Aspiration before swallow Pharyngeal Material enters airway, CONTACTS cords and not ejected out Pharyngeal- Puree WFL Pharyngeal Material does not enter airway Pharyngeal- Mechanical Soft WFL Pharyngeal Material does not enter airway Pharyngeal- Regular -- Pharyngeal -- Pharyngeal- Multi-consistency -- Pharyngeal -- Pharyngeal- Pill WFL Pharyngeal Material does not enter airway Pharyngeal Comment --  CHL IP CERVICAL ESOPHAGEAL PHASE 11/23/2020 Cervical Esophageal Phase WFL Pudding Teaspoon -- Pudding Cup -- Honey Teaspoon -- Honey Cup -- Nectar Teaspoon -- Nectar Cup -- Nectar Straw -- Thin Teaspoon -- Thin Cup -- Thin Straw -- Puree -- Mechanical Soft -- Regular -- Multi-consistency -- Pill -- Cervical Esophageal Comment -- [name], MS [facility] SLP Acute Rehab Services Office [phone] Pager [phone] [name] 11/23/2020, 1:16 PM               DISCHARGE EXAMINATION: Vitals:   11/24/20 1339 11/24/20 2022 11/25/20 0528 11/25/20 0532  BP: 125/69 139/83  124/72  Pulse: 77 90  82  Resp: Temp: 98.3 F (36.8  C) 97.8 F (36.6 C)  97.6 F (36.4 C)  TempSrc: Oral Oral  Oral  SpO2: 96% 96%  98%  Weight:   76.3 kg   Height:       See progress note from earlier today  DISPOSITION: SNF  Discharge Instructions     Call MD for:  difficulty breathing, headache or visual disturbances   Complete by: As directed    Call MD for:  extreme fatigue   Complete by: As directed    Call MD for:  persistant dizziness or light-headedness   Complete by: As directed    Call MD for:  persistant nausea and vomiting   Complete by: As directed    Call MD for:  severe uncontrolled pain   Complete by: As directed    Call MD for:  temperature >100.4   Complete by: As directed    Discharge instructions   Complete by: As directed    Please review instructions on the discharge summary.  You were cared for by a hospitalist during your hospital stay. If you have any questions about your discharge medications or the care you received while you were in the hospital after you are discharged, you can call the unit and asked to speak with the hospitalist on call if the hospitalist that took care of you is not available. Once you are discharged, your primary care physician will handle any further medical issues. Please note that NO REFILLS for any discharge medications will be authorized once you are discharged, as it is imperative that you return to your primary care physician (or establish a relationship with a primary care physician if you do not have one) for your aftercare needs so that they can reassess your need for medications and monitor your lab values. If you do not have a primary care physician, you can call [phone] for a physician referral.   Increase activity slowly   Complete by: As directed    No wound care   Complete by: As directed           Allergies as of 11/25/2020       Reactions   Bactrim [  sulfamethoxazole-trimethoprim] Hives        Medication List     TAKE these medications     atorvastatin 40 MG tablet Commonly known as: LIPITOR Take 40 mg by mouth daily.   CALCIUM 500 + D PO Take 500 mg by mouth daily.   carbidopa-levodopa 25-100 MG tablet Commonly known as: SINEMET IR 2 tablets at 10 AM/2 tablet at 1PM/2 tablets at 4pm/1 tablet at 7pm What changed:  how much to take how to take this when to take this additional instructions   carbidopa-levodopa 50-200 MG tablet Commonly known as: SINEMET CR TAKE 1 TABLET AT BEDTIME What changed: Another medication with the same name was changed. Make sure you understand how and when to take each.   cephALEXin 500 MG capsule Commonly known as: KEFLEX Take 1 capsule (500 mg total) by mouth every 8 (eight) hours for 4 days.   escitalopram 10 MG tablet Commonly known as: Lexapro Take 1 tablet (10 mg total) by mouth daily.   feeding supplement Liqd Take 237 mLs by mouth 2 (two) times daily between meals.   finasteride 5 MG tablet Commonly known as: PROSCAR Take 5 mg by mouth daily.   gabapentin 300 MG capsule Commonly known as: NEURONTIN Take 600 mg by mouth at bedtime.   ketoconazole 2 % shampoo Commonly known as: NIZORAL Apply 1 application topically 2 (two) times a week.   MELATONIN PO Take 6 mg by mouth at bedtime.   memantine 5 MG tablet Commonly known as: NAMENDA TAKE 1 TABLET TWICE A DAY   metFORMIN 1000 MG tablet Commonly known as: GLUCOPHAGE Take 1,000 mg by mouth 2 (two) times daily with a meal.   midodrine 10 MG tablet Commonly known as: PROAMATINE Take 10 mg by mouth daily. Can take additional tab if systolic under 110   multivitamin with minerals Tabs tablet Take 1 tablet by mouth daily.   NONFORMULARY OR COMPOUNDED ITEM ONE DEVICE Dispense U-step 2  DX: g20   rivastigmine 9.5 mg/24hr Commonly known as: EXELON Place 9.5 mg onto the skin daily.           TOTAL DISCHARGE TIME: 35 minutes  Clarabell Matsuoka [employer] on [email or web address]  11/25/2020,  11:13 AM

## 2020-11-25 NOTE — TOC Progression Note (Signed)
Transition of Care [facility]) - Progression Note   Patient Details  Name: Ryan Morales MRN: [id number] Date of Birth: Dec 27, 1945  [facility] CM/SW Contact  Ewing Schlein, LCSW Phone Number: 11/25/2020, 9:12 AM  Clinical Narrative: CSW called [employer] to follow up with insurance authorization for SNF (reference #[id number]). Per Rosann Auerbach, the clinicals are still under review. TOC awaiting insurance approval.  Expected Discharge Plan: Skilled Nursing Facility Barriers to Discharge: Continued Medical Work up  Expected Discharge Plan and Services Expected Discharge Plan: Skilled Nursing Facility Discharge Planning Services: CM Consult Post Acute Care Choice: Skilled Nursing Facility Living arrangements for the past 2 months: Single Family Home  Readmission Risk Interventions No flowsheet data found.

## 2020-11-26 LAB — GLUCOSE, CAPILLARY
Glucose-Capillary: 180 mg/dL — ABNORMAL HIGH (ref 70–99)
Glucose-Capillary: 149 mg/dL — ABNORMAL HIGH (ref 70–99)
Glucose-Capillary: 150 mg/dL — ABNORMAL HIGH (ref 70–99)
Glucose-Capillary: 167 mg/dL — ABNORMAL HIGH (ref 70–99)

## 2020-11-26 LAB — HEMOGLOBIN A1C
Hgb A1c MFr Bld: 7.5 % — ABNORMAL HIGH (ref 4.8–5.6)
Mean Plasma Glucose: 169 mg/dL

## 2020-11-26 LAB — CULTURE, BLOOD (ROUTINE X 2)
Culture: NO GROWTH
Culture: NO GROWTH
Special Requests: ADEQUATE
Special Requests: ADEQUATE

## 2020-11-26 NOTE — Progress Notes (Signed)
Patient refused CPAP for the night  

## 2020-11-26 NOTE — Progress Notes (Signed)
[facility] PROGRESS NOTE   Ryan Morales NTI:[id number] DOB: 1945/08/29 DOA: 11/21/2020  PCP: [name], MD  Brief History/Interval Summary: 75 y.o. male with medical history significant for Parkinson's Disease, chronic indwelling foley catheter in setting of BPH, DM2, OSA on QHS CPAP, HLD, who was admitted to [facility] on 11/21/2020 with suspected acute metabolic encephalopathy in setting of severe sepsis due to UTI after presenting from home to [facility] ED for eval of altered mental status.  Reason for Visit: Acute metabolic encephalopathy.  Urinary tract infection  Consultants: None  Procedures: None  Antibiotics: Anti-infectives (From admission, onward)    Start     Dose/Rate Route Frequency Ordered Stop   11/25/20 0000  cephALEXin (KEFLEX) 500 MG capsule        500 mg Oral Every 8 hours 11/25/20 1112 11/29/20 2359   11/24/20 1400  cephALEXin (KEFLEX) capsule 500 mg        500 mg Oral Every 8 hours 11/24/20 1206     11/22/20 1800  cefTRIAXone (ROCEPHIN) 1 g in sodium chloride 0.9 % 100 mL IVPB  Status:  Discontinued        1 g 200 mL/hr over 30 Minutes Intravenous Every 24 hours 11/21/20 1847 11/24/20 1206   11/21/20 1745  cefTRIAXone (ROCEPHIN) 1 g in sodium chloride 0.9 % 100 mL IVPB        1 g 200 mL/hr over 30 Minutes Intravenous  Once 11/21/20 1740 11/21/20 1829   11/21/20 1645  cefTRIAXone (ROCEPHIN) 1 g in sodium chloride 0.9 % 100 mL IVPB  Status:  Discontinued        1 g 200 mL/hr over 30 Minutes Intravenous  Once 11/21/20 1633 11/21/20 1635       Subjective/Interval History: Patient somnolent this morning.  Easily arousable.  In no discomfort or distress.   Assessment/Plan:  Acute metabolic encephalopathy history of Parkinson's dementia Family members noted progressive confusion over the last week or so prior to admission.  Thought to be secondary to urinary tract infection.  No other etiology has been found.  No obvious focal  neurological deficits noted.  Seizures thought to be less likely. TSH noted to be normal at 2.29.  Urine drug screen was unremarkable.  Based on patient's neurologist note (Dr. [name]) from May 2022 patient does have Parkinson's dementia.  Had neurocognitive testing in 2021.   Mentation has improved per patient's family.  Probably close to baseline now.   Urinary tract infection in the setting of chronic indwelling Foley catheter with severe sepsis Lactic acid level level was noted to be elevated and has become normal.  There could be an element of colonization as well. Patient initially started on ceftriaxone.  Cultures without any growth so far.   Patient was transitioned to Keflex. Catheter was last changed at his urologist office in [facility] in mid May.   Discussed with the inpatient neurology team and they went ahead and change his catheter on 6/11.  Patient will need to follow-up with his primary urologist.  Generalized weakness Likely in the setting of acute infection.  No obvious focal neurological deficits noted.  PT and OT evaluation.  SNF is recommended for rehab.  History of Parkinson's disease/Parkinson's dementia Noted to be on carbidopa/levodopa.  Also on rivastigmine.  These are being continued.  Diabetes mellitus type 2 without complication On metformin at home.  Monitor CBGs.  May resume metformin at discharge.  HbA1c 7.5.  History of obstructive sleep apnea CPAP  at nighttime.  Hyperlipidemia On atorvastatin at home.    DVT Prophylaxis: SCDs Code Status: Full code Family Communication: Wife been updated periodically.   Disposition Plan: Needs to go to skilled nursing facility for rehab.  Waiting on insurance authorization.  Status is: Inpatient  Remains inpatient appropriate because:Altered mental status and IV treatments appropriate due to intensity of illness or inability to take PO  Dispo: The patient is from: Home              Anticipated d/c is to: SNF               Patient currently is medically stable to d/c.   Difficult to place patient No      Medications: Scheduled:  carbidopa-levodopa  1 tablet Oral QHS   carbidopa-levodopa  1 tablet Oral Q24H   carbidopa-levodopa  2 tablet Oral 3 times per day   cephALEXin  500 mg Oral Q8H   Chlorhexidine Gluconate Cloth  6 each Topical Daily   feeding supplement  237 mL Oral BID BM   finasteride  5 mg Oral Daily   insulin aspart  0-15 Units Subcutaneous TID WC   insulin aspart  0-5 Units Subcutaneous QHS   memantine  5 mg Oral BID   midodrine  10 mg Oral Daily   multivitamin with minerals  1 tablet Oral Daily   rivastigmine  9.5 mg Transdermal Daily   Continuous:   MVE:[id number] **OR** acetaminophen   Objective:  Vital Signs  Vitals:   11/25/20 2111 11/26/20 0500 11/26/20 0502 11/26/20 0948  BP: (!) 149/92  117/73 115/66  Pulse: 84  78 80  Resp: 18  16   Temp: 98.4 F (36.9 C)  98 F (36.7 C)   TempSrc: Oral  Oral   SpO2: 98%  97%   Weight:  77.1 kg    Height:        Intake/Output Summary (Last 24 hours) at 11/26/2020 1205 Last data filed at 11/26/2020 0845 Gross per 24 hour  Intake 480 ml  Output 1450 ml  Net -970 ml    Filed Weights   11/24/20 0500 11/25/20 0528 11/26/20 0500  Weight: 78.5 kg 76.3 kg 77.1 kg    General appearance: Somnolent but easily arousable Resp: Clear to auscultation bilaterally.  Normal effort Cardio: S1-S2 is normal regular.  No S3-S4.  No rubs murmurs or bruit GI: Abdomen is soft.  Nontender nondistended.  Bowel sounds are present normal.  No masses organomegaly      Lab Results:  Data Reviewed: I have personally reviewed following labs and imaging studies  CBC: Recent Labs  Lab 11/21/20 1608 11/22/20 0529 11/23/20 0453  WBC 12.2* 6.4 7.3  NEUTROABS  --  4.1  --   HGB 12.3* 11.3* 12.0*  HCT 36.6* 33.9* 36.7*  MCV 88.4 89.4 89.3  PLT 251 223 235     Basic Metabolic Panel: Recent Labs  Lab 11/21/20 1608  11/21/20 1830 11/22/20 0529 11/23/20 0453 11/24/20 0541 11/25/20 0531  NA 135  --  140 139 137 136  K 4.0  --  4.4 4.2 3.9 3.9  CL 97*  --  104 101 100 100  CO2 27  --  30 31 27 27   GLUCOSE 191*  --  139* 124* 144* 191*  BUN 16  --  11 10 12 17   CREATININE 0.87  --  0.86 0.75 0.66 0.78  CALCIUM 9.6  --  9.2 9.2 9.5 9.4  MG  --  1.9 1.6*  --   --   --      GFR: Estimated Creatinine Clearance: 75.7 mL/min (by C-G formula based on SCr of 0.78 mg/dL).  Liver Function Tests: Recent Labs  Lab 11/22/20 0529  AST 23  ALT 19  ALKPHOS 72  BILITOT 1.1  PROT 6.6  ALBUMIN 3.7      Coagulation Profile: Recent Labs  Lab 11/21/20 1830  INR 1.0      CBG: Recent Labs  Lab 11/25/20 1139 11/25/20 1638 11/25/20 2146 11/26/20 0716 11/26/20 1141  GLUCAP 195* 119* 127* 180* 149*      Thyroid Function Tests: No results for input(s): TSH, T4TOTAL, FREET4, T3FREE, THYROIDAB in the last 72 hours.    Recent Results (from the past 240 hour(s))  Urine C&S     Status: Abnormal   Collection Time: 11/21/20  4:08 PM   Specimen: Urine, Random  Result Value Ref Range Status   Specimen Description   Final    URINE, RANDOM Performed at [facility], [address], [place], [place] [address]    Special Requests   Final    NONE Performed at [facility], [address], [place], [place] [address]    Culture MULTIPLE SPECIES PRESENT, SUGGEST RECOLLECTION (A)  Final   Report Status 11/23/2020 FINAL  Final  Resp Panel by RT-PCR (Flu A&B, Covid) Nasopharyngeal Swab     Status: None   Collection Time: 11/21/20  5:30 PM   Specimen: Nasopharyngeal Swab; Nasopharyngeal(NP) swabs in vial transport medium  Result Value Ref Range Status   SARS Coronavirus 2 by RT PCR NEGATIVE NEGATIVE Final    Comment: (NOTE) SARS-CoV-2 target nucleic acids are NOT DETECTED.  The SARS-CoV-2 RNA is generally detectable in upper respiratory specimens during the  acute phase of infection. The lowest concentration of SARS-CoV-2 viral copies this assay can detect is 138 copies/mL. A negative result does not preclude SARS-Cov-2 infection and should not be used as the sole basis for treatment or other patient management decisions. A negative result may occur with  improper specimen collection/handling, submission of specimen other than nasopharyngeal swab, presence of viral mutation(s) within the areas targeted by this assay, and inadequate number of viral copies(<138 copies/mL). A negative result must be combined with clinical observations, patient history, and epidemiological information. The expected result is Negative.  Fact Sheet for Patients:  BloggerCourse.com  Fact Sheet for Healthcare Providers:  SeriousBroker.it  This test is no t yet approved or cleared by the [place] FDA and  has been authorized for detection and/or diagnosis of SARS-CoV-2 by FDA under an Emergency Use Authorization (EUA). This EUA will remain  in effect (meaning this test can be used) for the duration of the COVID-19 declaration under Section 564(b)(1) of the Act, 21 U.S.C.section 360bbb-3(b)(1), unless the authorization is terminated  or revoked sooner.       Influenza A by PCR NEGATIVE NEGATIVE Final   Influenza B by PCR NEGATIVE NEGATIVE Final    Comment: (NOTE) The Xpert Xpress SARS-CoV-2/FLU/RSV plus assay is intended as an aid in the diagnosis of influenza from Nasopharyngeal swab specimens and should not be used as a sole basis for treatment. Nasal washings and aspirates are unacceptable for Xpert Xpress SARS-CoV-2/FLU/RSV testing.  Fact Sheet for Patients: BloggerCourse.com  Fact Sheet for Healthcare Providers: SeriousBroker.it  This test is not yet approved or cleared by the [place] FDA and has been authorized for detection and/or  diagnosis of SARS-CoV-2 by  FDA under an Emergency Use Authorization (EUA). This EUA will remain in effect (meaning this test can be used) for the duration of the COVID-19 declaration under Section 564(b)(1) of the Act, 21 U.S.C. section 360bbb-3(b)(1), unless the authorization is terminated or revoked.  Performed at [facility], [address] [place], [place] [address]   Urine culture     Status: None   Collection Time: 11/21/20  6:24 PM   Specimen: In/Out Cath Urine  Result Value Ref Range Status   Specimen Description   Final    IN/OUT CATH URINE Performed at [facility], [address] [place], [place] [address]    Special Requests   Final    NONE Performed at [facility], [address] [place], [place] [address]    Culture   Final    NO GROWTH Performed at [facility], [address] [place], [place] [address]    Report Status 11/23/2020 FINAL  Final  Blood Culture (routine x 2)     Status: None   Collection Time: 11/21/20  6:30 PM   Specimen: BLOOD  Result Value Ref Range Status   Specimen Description   Final    BLOOD RIGHT ANTECUBITAL Performed at [facility], [address] [place], [place] [address]    Special Requests   Final    BOTTLES DRAWN AEROBIC AND ANAEROBIC Blood Culture adequate volume Performed at [facility], [address] [place], [place] [address]    Culture   Final    NO GROWTH 5 DAYS Performed at [facility], [address] [place], [place] [address]    Report Status 11/26/2020 FINAL  Final  Blood Culture (routine x 2)     Status: None   Collection Time: 11/21/20  6:34 PM   Specimen: BLOOD RIGHT HAND  Result Value Ref Range Status   Specimen Description   Final    BLOOD RIGHT HAND Performed at [facility], [address] [place], [place] [address]    Special Requests   Final    BOTTLES DRAWN AEROBIC  AND ANAEROBIC Blood Culture adequate volume Performed at [facility], [address] [place], [place] [address]    Culture   Final    NO GROWTH 5 DAYS Performed at [facility], [address] [place], [place] [address]    Report Status 11/26/2020 FINAL  Final       Radiology Studies: No results found.     LOS: 5 days   Tecumseh Yeagley Foot LockerKrishnan  [facility] Pager on [email or web address]  11/26/2020, 12:05 PM

## 2020-11-26 NOTE — TOC Progression Note (Signed)
Transition of Care [facility]) - Progression Note    Patient Details  Name: Ryan Morales MRN: [id number] Date of Birth: 1946/01/03  [facility]) CM/SW Contact  Nicola Heinemann, Olegario Messier, RN Phone Number: 11/26/2020, 9:25 AM  Clinical Narrative:  f/u on Insurance auth w/[employer] ref#[id number]-tel#[phone]-still awating Berkley Harvey for SNF-[facility]-clinicals in review.    Expected Discharge Plan: Skilled Nursing Facility Barriers to Discharge: Insurance Authorization  Expected Discharge Plan and Services Expected Discharge Plan: Skilled Nursing Facility   Discharge Planning Services: CM Consult Post Acute Care Choice: Skilled Nursing Facility Living arrangements for the past 2 months: Single Family Home Expected Discharge Date: 11/25/20                                     Social Determinants of Health (SDOH) Interventions    Readmission Risk Interventions No flowsheet data found.

## 2020-11-27 ENCOUNTER — Telehealth (HOSPITAL_COMMUNITY): Payer: Self-pay

## 2020-11-27 LAB — GLUCOSE, CAPILLARY
Glucose-Capillary: 137 mg/dL — ABNORMAL HIGH (ref 70–99)
Glucose-Capillary: 179 mg/dL — ABNORMAL HIGH (ref 70–99)
Glucose-Capillary: 143 mg/dL — ABNORMAL HIGH (ref 70–99)
Glucose-Capillary: 233 mg/dL — ABNORMAL HIGH (ref 70–99)

## 2020-11-27 LAB — SARS CORONAVIRUS 2 (TAT 6-24 HRS): SARS Coronavirus 2: NEGATIVE

## 2020-11-27 MED ORDER — CEPHALEXIN 500 MG PO CAPS: 500.0000 mg | ORAL_CAPSULE | Freq: Three times a day (TID) | ORAL | 0 refills | Status: DC

## 2020-11-27 NOTE — Progress Notes (Signed)
Physical Therapy Treatment Patient Details Name: Ryan Morales MRN: [id number] DOB: [birth year] Today's Date: 11/27/2020    History of Present Illness Ryan Morales is a 75 y.o. male who is admitted to [facility] on 11/21/2020 with suspected acute metabolic encephalopathy in setting of severe sepsis due to UTI. PMH: Parkinson's Disease, chronic indwelling foley catheter in setting of BPH, DM2, OSA, HLD    PT Comments    Max assist for supine to sit. Improved sitting balance today, pt able to maintain upright sitting position with feet and single UE supported. Attempted sit to stand with RW, however pt unable to come to full upright position with max assist. Performed supine LE exercises.  SNF recommended.    Follow Up Recommendations  SNF     Equipment Recommendations  None recommended by PT    Recommendations for Other Services       Precautions / Restrictions Precautions Precautions: Fall Restrictions Weight Bearing Restrictions: No    Mobility  Bed Mobility Overal bed mobility: Needs Assistance Bed Mobility: Supine to Sit;Sit to Supine     Supine to sit: Max assist;HOB elevated Sit to supine: Max assist   General bed mobility comments: pt assists by initiating advancing BLEs to EOB and initiating raising trunk, assist needed to pivot hips and fully raist trunk to upright    Transfers Overall transfer level: Needs assistance Equipment used: Rolling walker (2 wheeled) Transfers: Sit to/from Stand           General transfer comment: attempted sit to stand with RW, pt unable to come to full upright position 2* heavy posterior lean which could not be corrected with max assistance  Ambulation/Gait             General Gait Details: unable   Stairs             Wheelchair Mobility    Modified Rankin (Stroke Patients Only)       Balance Overall balance assessment: Needs assistance Sitting-balance support: Feet supported;Bilateral upper  extremity supported Sitting balance-Leahy Scale: Poor Sitting balance - Comments: min-max A for sitting EOB for ~10 minutes, frequent multimodal cues for midline but pt slowly returns to posterior lean Postural control: Posterior lean Standing balance support: Bilateral upper extremity supported Standing balance-Leahy Scale: Zero Standing balance comment: heavy posterior lean                            Cognition Arousal/Alertness: Awake/alert Behavior During Therapy: WFL for tasks assessed/performed Overall Cognitive Status: No family/caregiver present to determine baseline cognitive functioning                                 General Comments: Pt slow to respond to questions/commands, requires increased time to follow 1 step commands, pleasant      Exercises General Exercises - Lower Extremity Ankle Circles/Pumps: AAROM;Both;15 reps;Supine Heel Slides: AAROM;Both;10 reps;Supine Hip ABduction/ADduction: AAROM;Both;10 reps;Supine Other Exercises Other Exercises: trunk rotation in hook lying AAROM x 4 to L and to R    General Comments        Pertinent Vitals/Pain Pain Assessment: No/denies pain    Home Living                      Prior Function            PT Goals (current goals can now be found in  the care plan section) Acute Rehab PT Goals Patient Stated Goal: agreeable to therapy PT Goal Formulation: With patient Time For Goal Achievement: 12/06/20 Potential to Achieve Goals: Fair Progress towards PT goals: Progressing toward goals    Frequency    Min 2X/week      PT Plan Current plan remains appropriate    Co-evaluation              AM-PAC PT "6 Clicks" Mobility   Outcome Measure  Help needed turning from your back to your side while in a flat bed without using bedrails?: Total Help needed moving from lying on your back to sitting on the side of a flat bed without using bedrails?: Total Help needed moving to and  from a bed to a chair (including a wheelchair)?: Total Help needed standing up from a chair using your arms (e.g., wheelchair or bedside chair)?: Total Help needed to walk in hospital room?: Total Help needed climbing 3-5 steps with a railing? : Total 6 Click Score: 6    End of Session Equipment Utilized During Treatment: Gait belt Activity Tolerance: Patient limited by fatigue (limited by rigidity) Patient left: in bed;with call bell/phone within reach;with bed alarm set Nurse Communication: Mobility status PT Visit Diagnosis: Unsteadiness on feet (R26.81);Other abnormalities of gait and mobility (R26.89);Muscle weakness (generalized) (M62.81);Other symptoms and signs involving the nervous system (R29.898)     Time: 0623-7628 PT Time Calculation (min) (ACUTE ONLY): 20 min  Charges:  $Therapeutic Activity: 8-22 mins                     Ralene Bathe Kistler PT 11/27/2020  Acute Rehabilitation Services Pager [phone] Office [phone]

## 2020-11-27 NOTE — Progress Notes (Signed)
Received pt asleep on bed comfortable not in distress on room air, no IV cannula, with foley catheter to bag, noted red colored urine output in the urine bag and pinkish colored urine output in the tubing of urine bag, as reported it was a chronic foley catheter, doctor aware about bleeding from urine and was flushed with saline as reported

## 2020-11-27 NOTE — TOC Progression Note (Addendum)
Transition of Care [facility]) - Progression Note    Patient Details  Name: Ryan Morales MRN: [id number] Date of Birth: 12/17/1945  Transition [facility]) CM/SW Contact  Elihu Milstein, Olegario Messier, RN Phone Number: 11/27/2020, 12:08 PM  Clinical Narrative:  [employer] insurance auth tel#[phone]-new ref#[id number] awaiting auth/still pending for  [employer].will need new covid. 2:54p-auth received from [employer] auth#[id number] for 5days ending 6/16-[facility] rep Lowella Bandy can accept tomorrow-covid to be ordered-MD notified.     Expected Discharge Plan: Skilled Nursing Facility Barriers to Discharge: Insurance Authorization  Expected Discharge Plan and Services Expected Discharge Plan: Skilled Nursing Facility   Discharge Planning Services: CM Consult Post Acute Care Choice: Skilled Nursing Facility Living arrangements for the past 2 months: Single Family Home Expected Discharge Date: 11/25/20                                     Social Determinants of Health (SDOH) Interventions    Readmission Risk Interventions No flowsheet data found.

## 2020-11-27 NOTE — Discharge Summary (Addendum)
[facility]  Physician Discharge Summary   Patient ID: Ryan Morales MRN: [id number] DOB/AGE: 09/01/1945 75 y.o.  Admit date: 11/21/2020 Discharge date:   11/29/2020  PCP: [name], MD  DISCHARGE DIAGNOSES:  Acute metabolic encephalopathy secondary to urinary tract infection Urinary tract infection Chronic indwelling Foley catheter Severe sepsis, resolved History of Parkinson's disease History of Parkinson's dementia Diabetes mellitus type 2 without complications History of obstructive sleep apnea Hyperlipidemia   RECOMMENDATIONS FOR OUTPATIENT FOLLOW UP: Patient needs to go to skilled nursing facility for rehab as he is physically deconditioned from his acute illness. Patient will need to see his primary urologist, Dr. Logan Bores, at [facility] in the next 2 to 3 weeks    Home Health: Going to SNF Equipment/Devices: None  CODE STATUS: Full code  DISCHARGE CONDITION: fair  Diet recommendation: Dysphagia 3 diet with thin liquids  INITIAL HISTORY: 75 y.o. male with medical history significant for Parkinson's Disease, chronic indwelling foley catheter in setting of BPH, DM2, OSA on QHS CPAP, HLD, who was admitted to [facility] on 11/21/2020 with suspected acute metabolic encephalopathy in setting of severe sepsis due to UTI after presenting from home to [facility] ED for eval of altered mental status.     HOSPITAL COURSE:   Acute metabolic encephalopathy  Family members noted progressive confusion over the last week or so prior to admission.  Thought to be secondary to urinary tract infection.  No other etiology has been found.  No obvious focal neurological deficits noted.  Seizures thought to be less likely. TSH noted to be normal at 2.29.  Urine drug screen was unremarkable. Based on patient's neurologist note (Dr. [name]) from May 2022 patient does have Parkinson's dementia.  Had neurocognitive testing in 2021.   Mentation has improved per  patient's family.  Probably close to baseline now.  Urinary tract infection in the setting of chronic indwelling Foley catheter with severe sepsis Lactic acid level level was noted to be elevated and has become normal.  There could be an element of colonization as well. Patient initially started on ceftriaxone.  Cultures without any growth so far.   Patient was transitioned to Keflex. Catheter was last changed at his urologist office in [facility] in mid May.    Discussed with the inpatient neurology team and they went ahead and changed his catheter on 6/11.  Patient will need to follow-up with his primary urologist.   Generalized weakness Likely in the setting of acute infection.  No obvious focal neurological deficits noted.  Seen by physical therapy. SNF is recommended for rehab.  History of Parkinson's disease/Parkinson's dementia Noted to be on carbidopa/levodopa.  Also on rivastigmine.  These are being continued.  Diabetes mellitus type 2 without complication Resume metformin at discharge.  HbA1c 7.5.  History of obstructive sleep apnea CPAP at nighttime.  Hyperlipidemia On atorvastatin at home.  6/16Addendum: No overnight issues.  Off mittens last 24 hours  Patient is stable.  Okay for discharge to SNF .   PERTINENT LABS:  The results of significant diagnostics from this hospitalization (including imaging, microbiology, ancillary and laboratory) are listed below for reference.    Microbiology: Recent Results (from the past 240 hour(s))  Urine C&S     Status: Abnormal   Collection Time: 11/21/20  4:08 PM   Specimen: Urine, Random  Result Value Ref Range Status   Specimen Description   Final    URINE, RANDOM Performed at [facility], [address] [place], [place] [address]    Special Requests   Final    NONE Performed at [facility], [address], [place], [place] [address]    Culture MULTIPLE SPECIES PRESENT, SUGGEST  RECOLLECTION (A)  Final   Report Status 11/23/2020 FINAL  Final  Resp Panel by RT-PCR (Flu A&B, Covid) Nasopharyngeal Swab     Status: None   Collection Time: 11/21/20  5:30 PM   Specimen: Nasopharyngeal Swab; Nasopharyngeal(NP) swabs in vial transport medium  Result Value Ref Range Status   SARS Coronavirus 2 by RT PCR NEGATIVE NEGATIVE Final    Comment: (NOTE) SARS-CoV-2 target nucleic acids are NOT DETECTED.  The SARS-CoV-2 RNA is generally detectable in upper respiratory specimens during the acute phase of infection. The lowest concentration of SARS-CoV-2 viral copies this assay can detect is 138 copies/mL. A negative result does not preclude SARS-Cov-2 infection and should not be used as the sole basis for treatment or other patient management decisions. A negative result may occur with  improper specimen collection/handling, submission of specimen other than nasopharyngeal swab, presence of viral mutation(s) within the areas targeted by this assay, and inadequate number of viral copies(<138 copies/mL). A negative result must be combined with clinical observations, patient history, and epidemiological information. The expected result is Negative.  Fact Sheet for Patients:  BloggerCourse.com  Fact Sheet for Healthcare Providers:  SeriousBroker.it  This test is no t yet approved or cleared by the [place] FDA and  has been authorized for detection and/or diagnosis of SARS-CoV-2 by FDA under an Emergency Use Authorization (EUA). This EUA will remain  in effect (meaning this test can be used) for the duration of the COVID-19 declaration under Section 564(b)(1) of the Act, 21 U.S.C.section 360bbb-3(b)(1), unless the authorization is terminated  or revoked sooner.       Influenza A by PCR NEGATIVE NEGATIVE Final   Influenza B by PCR NEGATIVE NEGATIVE Final    Comment: (NOTE) The Xpert Xpress SARS-CoV-2/FLU/RSV plus assay  is intended as an aid in the diagnosis of influenza from Nasopharyngeal swab specimens and should not be used as a sole basis for treatment. Nasal washings and aspirates are unacceptable for Xpert Xpress SARS-CoV-2/FLU/RSV testing.  Fact Sheet for Patients: BloggerCourse.com  Fact Sheet for Healthcare Providers: SeriousBroker.it  This test is not yet approved or cleared by the [place] FDA and has been authorized for detection and/or diagnosis of SARS-CoV-2 by FDA under an Emergency Use Authorization (EUA). This EUA will remain in effect (meaning this test can be used) for the duration of the COVID-19 declaration under Section 564(b)(1) of the Act, 21 U.S.C. section 360bbb-3(b)(1), unless the authorization is terminated or revoked.  Performed at [facility], [address] [place], [place] [address]   Urine culture     Status: None   Collection Time: 11/21/20  6:24 PM   Specimen: In/Out Cath Urine  Result Value Ref Range Status   Specimen Description   Final    IN/OUT CATH URINE Performed at [facility], [address], [place], [place] [address]    Special Requests   Final    NONE Performed at [facility], [address] [place], [place] [address]    Culture   Final    NO GROWTH Performed at [facility], [address] [place], [place] [address]    Report Status 11/23/2020 FINAL  Final  Blood Culture (routine x 2)  Status: None   Collection Time: 11/21/20  6:30 PM   Specimen: BLOOD  Result Value Ref Range Status   Specimen Description   Final    BLOOD RIGHT ANTECUBITAL Performed at [facility], [address] [place], [place] [address]    Special Requests   Final    BOTTLES DRAWN AEROBIC AND ANAEROBIC Blood Culture adequate volume Performed at [facility], [address], [place], [place] [address]     Culture   Final    NO GROWTH 5 DAYS Performed at [facility], [address] [place], [place] [address]    Report Status 11/26/2020 FINAL  Final  Blood Culture (routine x 2)     Status: None   Collection Time: 11/21/20  6:34 PM   Specimen: BLOOD RIGHT HAND  Result Value Ref Range Status   Specimen Description   Final    BLOOD RIGHT HAND Performed at [facility], [address] [place], [place] [address]    Special Requests   Final    BOTTLES DRAWN AEROBIC AND ANAEROBIC Blood Culture adequate volume Performed at [facility], [address] [place], [place] [address]    Culture   Final    NO GROWTH 5 DAYS Performed at [facility], [address] [place], [place] [address]    Report Status 11/26/2020 FINAL  Final     Labs:  COVID-19 Labs   Lab Results  Component Value Date   SARSCOV2NAA NEGATIVE 11/21/2020      Basic Metabolic Panel: Recent Labs  Lab 11/21/20 1608 11/21/20 1830 11/22/20 0529 11/23/20 0453 11/24/20 0541 11/25/20 0531  NA 135  --  140 139 137 136  K 4.0  --  4.4 4.2 3.9 3.9  CL 97*  --  104 101 100 100  CO2 27  --  GLUCOSE 191*  --  139* 124* 144* 191*  BUN 16  --  CREATININE 0.87  --  0.86 0.75 0.66 0.78  CALCIUM 9.6  --  9.2 9.2 9.5 9.4  MG  --  1.9 1.6*  --   --   --    Liver Function Tests: Recent Labs  Lab 11/22/20 0529  AST 23  ALT 19  ALKPHOS 72  BILITOT 1.1  PROT 6.6  ALBUMIN 3.7    CBC: Recent Labs  Lab 11/21/20 1608 11/22/20 0529 11/23/20 0453  WBC 12.2* 6.4 7.3  NEUTROABS  --  4.1  --   HGB 12.3* 11.3* 12.0*  HCT 36.6* 33.9* 36.7*  MCV 88.4 89.4 89.3  PLT 251 223 235     CBG: Recent Labs  Lab 11/26/20 0716 11/26/20 1141 11/26/20 1635 11/26/20 2153 11/27/20 0732  GLUCAP 180* 149* 150* 167* 137*     IMAGING STUDIES DG Chest Portable 1 View  Result Date: 11/21/2020 CLINICAL DATA:  Weakness EXAM: PORTABLE CHEST 1 VIEW  COMPARISON:  None. FINDINGS: The heart size and mediastinal contours are within normal limits. No focal airspace disease. No pleural effusion or pneumothorax. No acute osseous abnormality. IMPRESSION: No evidence of acute cardiopulmonary disease. Electronically Signed   By: Caprice Renshaw   On: 11/21/2020 18:06   DG Swallowing Func-Speech Pathology  Result Date: 11/23/2020 Formatting of this result is different from the original. Objective Swallowing Evaluation: Type of Study: Bedside Swallow Evaluation  Patient Details Name: Raynard Mapps MRN: [id number] Date of Birth: [birth year] Today's  Date: 11/23/2020 Time: SLP Start Time (ACUTE ONLY): 1210 -SLP Stop Time (ACUTE ONLY): 1235 SLP Time Calculation (min) (ACUTE ONLY): 25 min Past Medical History: Past Medical History: Diagnosis Date  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  S/p TURP  Dermatochalasis of both upper eyelids 10/04/2019  Dysphagia   Hyperlipidemia   Hypertension 02/15/2016  Impaired mobility and ADLs   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 10/04/2019  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 male who is admitted to [facility] on 11/21/2020 with suspected acute metabolic encephalopathy in setting of severe sepsis due to UTI. PMH: Parkinson's Disease, chronic indwelling foley catheter in setting of BPH, DM2, OSA,  HLD  Subjective: pt awake in bed Assessment / Plan / Recommendation CHL IP CLINICAL IMPRESSIONS 11/23/2020 Clinical Impression Pt with mild oral dysphagia due to his Parkinson's impacting initiation/coordination.  Trace aspiration x1 with initial bolus of thin and with sequential swallows of thin.  Initial aspiration produced reflexive cough but no further episodes.  Small single sips prevents aspiration and penetration.  Pharyngeal swallow is strong without retention.  Pt swallowed barium tablet with pudding well and advised he would like to continue this technique for medication administration.       SLP phoned RN to determine if pt received his Parkinson's medications today before testing, to which she advised he had. SLP Visit Diagnosis Dysphagia, oral phase (R13.11) Attention and concentration deficit following -- Frontal lobe and executive function deficit following -- Impact on safety and function Mild aspiration risk   CHL IP TREATMENT RECOMMENDATION 11/23/2020 Treatment Recommendations Therapy as outlined in treatment plan below   Prognosis 11/23/2020 Prognosis for Safe Diet Advancement Good Barriers to Reach Goals -- Barriers/Prognosis Comment -- CHL IP DIET RECOMMENDATION 11/23/2020 SLP Diet Recommendations Regular solids;Thin liquid Liquid Administration via Straw Medication Administration Whole meds with puree Compensations Slow rate;Small sips/bites Postural Changes --   CHL IP OTHER RECOMMENDATIONS 11/23/2020 Recommended Consults -- Oral Care Recommendations Oral care BID Other Recommendations --   No flowsheet data found.  CHL IP FREQUENCY AND DURATION 11/23/2020 Speech Therapy Frequency (ACUTE ONLY) min 1 x/week Treatment Duration 1 week      CHL IP ORAL PHASE 11/23/2020 Oral Phase Impaired Oral - Pudding Teaspoon -- Oral - Pudding Cup -- Oral - Honey Teaspoon -- Oral - Honey Cup -- Oral - Nectar Teaspoon -- Oral - Nectar Cup -- Oral - Nectar Straw Decreased bolus cohesion Oral - Thin Teaspoon Decreased  bolus cohesion Oral - Thin Cup NT Oral - Thin Straw Decreased bolus cohesion Oral - Puree WFL Oral - Mech Soft WFL Oral - Regular -- Oral - Multi-Consistency -- Oral - Pill WFL Oral Phase - Comment --  CHL IP PHARYNGEAL PHASE 11/23/2020 Pharyngeal Phase Impaired Pharyngeal- Pudding Teaspoon -- Pharyngeal -- Pharyngeal- Pudding Cup -- Pharyngeal -- Pharyngeal- Honey Teaspoon -- Pharyngeal -- Pharyngeal- Honey Cup -- Pharyngeal -- Pharyngeal- Nectar Teaspoon -- Pharyngeal -- Pharyngeal- Nectar Cup --  Pharyngeal -- Pharyngeal- Nectar Straw WFL Pharyngeal -- Pharyngeal- Thin Teaspoon Penetration/Aspiration before swallow Pharyngeal Material enters airway, passes BELOW cords without attempt by patient to eject out (silent aspiration) Pharyngeal- Thin Cup -- Pharyngeal -- Pharyngeal- Thin Straw Penetration/Aspiration before swallow Pharyngeal Material enters airway, CONTACTS cords and not ejected out Pharyngeal- Puree WFL Pharyngeal Material does not enter airway Pharyngeal- Mechanical Soft WFL Pharyngeal Material does not enter airway Pharyngeal- Regular -- Pharyngeal -- Pharyngeal- Multi-consistency -- Pharyngeal -- Pharyngeal- Pill WFL Pharyngeal Material does not enter airway Pharyngeal Comment --  CHL IP CERVICAL ESOPHAGEAL PHASE 11/23/2020 Cervical Esophageal Phase WFL Pudding Teaspoon -- Pudding Cup -- Honey Teaspoon -- Honey Cup -- Nectar Teaspoon -- Nectar Cup -- Nectar Straw -- Thin Teaspoon -- Thin Cup -- Thin Straw -- Puree -- Mechanical Soft -- Regular -- Multi-consistency -- Pill -- Cervical Esophageal Comment -- [name], MS [facility] SLP Acute Rehab Services Office [phone] Pager [phone] [name] 11/23/2020, 1:16 PM               DISCHARGE EXAMINATION: Vitals:   11/26/20 1243 11/26/20 2151 11/27/20 0500 11/27/20 0533  BP: (!) 141/71 (!) 149/90  (!) 157/86  Pulse: 92 88  73  Resp: 18 16  16   Temp: 98.1 F (36.7 C) 98.6 F (37 C)  98 F (36.7 C)  TempSrc: Oral Axillary  Axillary  SpO2:  94% 97%  99%  Weight:   74.5 kg   Height:       General appearance: Awake alert.  In no distress. remains distracted Resp: Clear to auscultation bilaterally.  Normal effort Cardio: S1-S2 is normal regular.  No S3-S4.  No rubs murmurs or bruit GI: Abdomen is soft.  Nontender nondistended.  Bowel sounds are present normal.  No masses organomegaly No edema    DISPOSITION: SNF  Discharge Instructions     Call MD for:  difficulty breathing, headache or visual disturbances   Complete by: As directed    Call MD for:  extreme fatigue   Complete by: As directed    Call MD for:  persistant dizziness or light-headedness   Complete by: As directed    Call MD for:  persistant nausea and vomiting   Complete by: As directed    Call MD for:  severe uncontrolled pain   Complete by: As directed    Call MD for:  temperature >100.4   Complete by: As directed    Discharge instructions   Complete by: As directed    Please review instructions on the discharge summary.  You were cared for by a hospitalist during your hospital stay. If you have any questions about your discharge medications or the care you received while you were in the hospital after you are discharged, you can call the unit and asked to speak with the hospitalist on call if the hospitalist that took care of you is not available. Once you are discharged, your primary care physician will handle any further medical issues. Please note that NO REFILLS for any discharge medications will be authorized once you are discharged, as it is imperative that you return to your primary care physician (or establish a relationship with a primary care physician if you do not have one) for your aftercare needs so that they can reassess your need for medications and monitor your lab values. If you do not have a primary care physician, you can call [phone] for a physician referral.   Increase activity slowly   Complete by:  As directed    No wound care    Complete by: As directed           Allergies as of 11/27/2020       Reactions   Bactrim [sulfamethoxazole-trimethoprim] Hives        Medication List     TAKE these medications    atorvastatin 40 MG tablet Commonly known as: LIPITOR Take 40 mg by mouth daily.   CALCIUM 500 + D PO Take 500 mg by mouth daily.   carbidopa-levodopa 25-100 MG tablet Commonly known as: SINEMET IR 2 tablets at 10 AM/2 tablet at 1PM/2 tablets at 4pm/1 tablet at 7pm   carbidopa-levodopa 50-200 MG tablet Commonly known as: SINEMET CR TAKE 1 TABLET AT BEDTIME   cephALEXin 500 MG capsule Commonly known as: KEFLEX Take 1 capsule (500 mg total) by mouth every 8 (eight) hours for 2 days.   escitalopram 10 MG tablet Commonly known as: Lexapro Take 1 tablet (10 mg total) by mouth daily.   feeding supplement Liqd Take 237 mLs by mouth 2 (two) times daily between meals.   finasteride 5 MG tablet Commonly known as: PROSCAR Take 5 mg by mouth daily.   gabapentin 300 MG capsule Commonly known as: NEURONTIN Take 600 mg by mouth at bedtime.   ketoconazole 2 % shampoo Commonly known as: NIZORAL Apply 1 application topically 2 (two) times a week.   MELATONIN PO Take 6 mg by mouth at bedtime.   memantine 5 MG tablet Commonly known as: NAMENDA TAKE 1 TABLET TWICE A DAY   metFORMIN 1000 MG tablet Commonly known as: GLUCOPHAGE Take 1,000 mg by mouth 2 (two) times daily with a meal.   midodrine 10 MG tablet Commonly known as: PROAMATINE Take 10 mg by mouth daily. Can take additional tab if systolic under 110   multivitamin with minerals Tabs tablet Take 1 tablet by mouth daily.   NONFORMULARY OR COMPOUNDED ITEM ONE DEVICE Dispense U-step 2  DX: g20   rivastigmine 9.5 mg/24hr Commonly known as: EXELON Place 9.5 mg onto the skin daily.           TOTAL DISCHARGE TIME: 35 minutes  Gokul [employer] LockerKrishnan  [facility] Pager on [email or web address]  11/27/2020, 9:58 AM

## 2020-11-27 NOTE — Progress Notes (Signed)
Seen pt awake on bed trying to go out of bed, advised not to go out of bed and need to call the nurse before doing it, pt agreed, seen that pt is holding and trying to pull out the foley catheter, noted dried and fresh blood from the scrotal area, above and below, foley cath and peri care done, advised pt not to pull out [facility]- pt denied of not pulling the FC, re- orient pt and showed hand on the [facility], pt answered with smile and removed hand from [facility]

## 2020-11-27 NOTE — Progress Notes (Addendum)
The patient is experiencing hematuria. Provider paged with update. V.O received to flush foley 50 cc NS to assist with assessment. Pt denies pain and or pulling at penis/foley.Will continue to monitor.

## 2020-11-27 NOTE — Care Management Important Message (Signed)
Important Message  Patient Details IM Letter given to the Patient. Name: Ryan Morales MRN: [id number] Date of Birth: [birth year]   Medicare Important Message Given:  Yes     [name] 11/27/2020, 1:05 PM

## 2020-11-27 NOTE — Progress Notes (Signed)
  Speech Language Pathology Treatment: Dysphagia  Patient Details Name: Ryan Morales MRN: [id number] DOB: [birth year] Today's Date: 11/27/2020 Time: 4287-6811 SLP Time Calculation (min) (ACUTE ONLY): 21 min  Assessment / Plan / Recommendation Clinical Impression  Cough post-swallow of thin water with HOB not fully upright, pt total cues and repositioning pt upright to at least 55* assisted to prevent s/s of aspiration.  Skilled SLP intevention included initiating RMST with [employer] PEP trainer set at 7 cm H20 pressure. Initially trainer was set at 9 cmH20 pressure with consistently reporting effort being 4/10 - however after last repetition - he admitted to dizziness.  SLP reviewed orally and in writing to RMST use and advise he use this with his SLP at SNF with approval to try a few times a day only.  Pt needed total cues to conduct RMST initially appropriately , fading to mod to min by end of session.     Voice is dysphonic due to Parkinson's but pt improves amplitude with verbal cues.  He admits to occasional coughing/choking with liquids more than foods. He reports increased ease of swallowing pills with pureed food and recommend continue. Intake listed as 100%!    Using teach back and written strategies, he was informed to follow aspiration precautions.  Given pt's dementia, it is advised that he follow up with SNF SLP to help maximize his swallowing rehab/airway protection/swallow efficiency. Pt potentially to dc today per notes, will follow in acute care for dysphagia management.      HPI HPI: Ryan Morales is a 76 y.o. male who is admitted to [facility] on 11/21/2020 with suspected acute metabolic encephalopathy in setting of severe sepsis due to UTI. PMH: Parkinson's Disease, chronic indwelling foley catheter in setting of BPH, DM2, OSA, HLD      SLP Plan  Continue with current plan of care       Recommendations  Diet recommendations: Dysphagia 3 (mechanical  soft);Thin liquid Liquids provided via: Cup;Straw Medication Administration: Whole meds with puree Compensations: Slow rate;Small sips/bites Postural Changes and/or Swallow Maneuvers: Seated upright 90 degrees;Upright 30-60 min after meal                Oral Care Recommendations: Oral care BID Follow up Recommendations: Skilled Nursing facility SLP Visit Diagnosis: Dysphagia, oropharyngeal phase (R13.12) Plan: Continue with current plan of care       GO               [name], MS [facility] SLP Acute Rehab Services Office [phone] Pager [phone]  [name] 11/27/2020, 4:33 PM

## 2020-11-27 NOTE — Progress Notes (Signed)
It appears IV was pulled accidentally by the patient. Provider updated. Pt is planning to discharge, provider ok with no iv access at this time.

## 2020-11-28 LAB — GLUCOSE, CAPILLARY
Glucose-Capillary: 139 mg/dL — ABNORMAL HIGH (ref 70–99)
Glucose-Capillary: 158 mg/dL — ABNORMAL HIGH (ref 70–99)
Glucose-Capillary: 113 mg/dL — ABNORMAL HIGH (ref 70–99)
Glucose-Capillary: 291 mg/dL — ABNORMAL HIGH (ref 70–99)

## 2020-11-28 NOTE — Progress Notes (Signed)
  Speech Language Pathology Treatment: Dysphagia  Patient Details Name: Ryan Morales MRN: [id number] DOB: [birth year] Today's Date: 11/28/2020 Time: 1017-5102 SLP Time Calculation (min) (ACUTE ONLY): 11 min  Assessment / Plan / Recommendation Clinical Impression  Pt awake and willing to work on dysphagia treatment/RMST.  He appears with mildly flat affect today but cooperative.  SLP provided him with water via straw x4 boluses - cough x1/4  and tea x4 - with no indication of aspiration. Pt admits to prior issues with coughing with water only. Advised he consider drinking slightly thicker liquids with meals if increases comfort.  His CXR has been clear however and minimal amount of aspiration of water may be tolerated.  Recommend monitor closely.  RMST conducted again with pt requiring initially total cues to perform.  Level was set at 7 cmH20 pressure with pt conducting 10 repetitions.  Pt cues faded to moderate for adequacy of respiratory strength with performance.   He will need assistance to perform this but performed today at much improved level.  Pt also with hypophonia and benefits from max cues to speak "loudly" to improve phonatory and respiratory strength for speech intelligibltiy and airway protection.  He is making great progress with implementation of RMST.  Will follow and please order SLP follow up at SNF for dysphagia management.    HPI HPI: Ryan Morales is a 75 y.o. male who is admitted to [facility] on 11/21/2020 with suspected acute metabolic encephalopathy in setting of severe sepsis due to UTI. PMH: Parkinson's Disease, chronic indwelling foley catheter in setting of BPH, DM2, OSA, HLD      SLP Plan  Continue with current plan of care       Recommendations  Diet recommendations: Dysphagia 3 (mechanical soft);Thin liquid Liquids provided via: Cup;Straw Medication Administration: Whole meds with puree Supervision: Patient able to self feed Compensations: Slow  rate;Small sips/bites Postural Changes and/or Swallow Maneuvers: Seated upright 90 degrees;Upright 30-60 min after meal                Oral Care Recommendations: Oral care BID Follow up Recommendations: Skilled Nursing facility SLP Visit Diagnosis: Dysphagia, oropharyngeal phase (R13.12) Plan: Continue with current plan of care       GO               [name], MS [facility] SLP Acute Rehab Services Office [phone] Pager [phone]  [name] 11/28/2020, 12:23 PM

## 2020-11-28 NOTE — Progress Notes (Signed)
PROGRESS NOTE    Ryan Morales  VCB:[id number] DOB: 07-22-1945 DOA: 11/21/2020 PCP: [name], MD    Brief Narrative:  Disease, chronic indwelling foley catheter in setting of BPH, DM2, OSA on QHS CPAP, HLD, who was admitted to [facility] on 11/21/2020 with suspected acute metabolic encephalopathy in setting of severe sepsis due to UTI after presenting from home to [facility] ED for eval of altered mental status  6/15-needs to be off of mittens for 24 hours to be able to go to SNF  Consultants:    Procedures:   Antimicrobials:      Subjective: Pt has no complaints. Sleepy this am but arousable.  Responds to my questions  Objective: Vitals:   11/27/20 2254 11/28/20 0543 11/28/20 0622 11/28/20 1306  BP:   136/75 117/73  Pulse: 79  71 70  Resp: 16  16 18   Temp:   99.8 F (37.7 C) 98.5 F (36.9 C)  TempSrc:   Oral Oral  SpO2: 99%   97%  Weight:  75.1 kg    Height:        Intake/Output Summary (Last 24 hours) at 11/28/2020 1423 Last data filed at 11/28/2020 1307 Gross per 24 hour  Intake 1090 ml  Output 1350 ml  Net -260 ml   Filed Weights   11/26/20 0500 11/27/20 0500 11/28/20 0543  Weight: 77.1 kg 74.5 kg 75.1 kg    Examination:  General exam: Appears calm and comfortable, sleepy but arousable Respiratory system: Clear to auscultation. Respiratory effort normal. Cardiovascular system: S1 & S2 heard, RRR. No JVD, murmurs, rubs, gallops or clicks.  Gastrointestinal system: Abdomen is nondistended, soft and nontender.  Normal bowel sounds heard. Central nervous system: Awakens with calling his name.  Grossly intact  extremities: No edema Psychiatry: Mood & affect appropriate In current setting    Data Reviewed: I have personally reviewed following labs and imaging studies  CBC: Recent Labs  Lab 11/21/20 1608 11/22/20 0529 11/23/20 0453  WBC 12.2* 6.4 7.3  NEUTROABS  --  4.1  --   HGB 12.3* 11.3* 12.0*  HCT 36.6* 33.9* 36.7*  MCV 88.4 89.4  89.3  PLT 251 223 235   Basic Metabolic Panel: Recent Labs  Lab 11/21/20 1608 11/21/20 1830 11/22/20 0529 11/23/20 0453 11/24/20 0541 11/25/20 0531  NA 135  --  140 139 137 136  K 4.0  --  4.4 4.2 3.9 3.9  CL 97*  --  104 101 100 100  CO2 27  --  30 31 27 27   GLUCOSE 191*  --  139* 124* 144* 191*  BUN 16  --  11 10 12 17   CREATININE 0.87  --  0.86 0.75 0.66 0.78  CALCIUM 9.6  --  9.2 9.2 9.5 9.4  MG  --  1.9 1.6*  --   --   --    GFR: Estimated Creatinine Clearance: 75.7 mL/min (by C-G formula based on SCr of 0.78 mg/dL). Liver Function Tests: Recent Labs  Lab 11/22/20 0529  AST 23  ALT 19  ALKPHOS 72  BILITOT 1.1  PROT 6.6  ALBUMIN 3.7   No results for input(s): LIPASE, AMYLASE in the last 168 hours. No results for input(s): AMMONIA in the last 168 hours. Coagulation Profile: Recent Labs  Lab 11/21/20 1830  INR 1.0   Cardiac Enzymes: No results for input(s): CKTOTAL, CKMB, CKMBINDEX, TROPONINI in the last 168 hours. BNP (last 3 results) No results for input(s): PROBNP in the last 8760  hours. HbA1C: No results for input(s): HGBA1C in the last 72 hours. CBG: Recent Labs  Lab 11/27/20 1154 11/27/20 1638 11/27/20 2227 11/28/20 0719 11/28/20 1152  GLUCAP 179* 143* 233* 139* 158*   Lipid Profile: No results for input(s): CHOL, HDL, LDLCALC, TRIG, CHOLHDL, LDLDIRECT in the last 72 hours. Thyroid Function Tests: No results for input(s): TSH, T4TOTAL, FREET4, T3FREE, THYROIDAB in the last 72 hours. Anemia Panel: No results for input(s): VITAMINB12, FOLATE, FERRITIN, TIBC, IRON, RETICCTPCT in the last 72 hours. Sepsis Labs: Recent Labs  Lab 11/21/20 1700 11/21/20 1833 11/22/20 0529  LATICACIDVEN 3.9* 3.3* 1.2    Recent Results (from the past 240 hour(s))  Urine C&S     Status: Abnormal   Collection Time: 11/21/20  4:08 PM   Specimen: Urine, Random  Result Value Ref Range Status   Specimen Description   Final    URINE, RANDOM Performed at [facility], [address], [place], [place] [address]    Special Requests   Final    NONE Performed at [facility], [address], [place], [place] [address]    Culture MULTIPLE SPECIES PRESENT, SUGGEST RECOLLECTION (A)  Final   Report Status 11/23/2020 FINAL  Final  Resp Panel by RT-PCR (Flu A&B, Covid) Nasopharyngeal Swab     Status: None   Collection Time: 11/21/20  5:30 PM   Specimen: Nasopharyngeal Swab; Nasopharyngeal(NP) swabs in vial transport medium  Result Value Ref Range Status   SARS Coronavirus 2 by RT PCR NEGATIVE NEGATIVE Final    Comment: (NOTE) SARS-CoV-2 target nucleic acids are NOT DETECTED.  The SARS-CoV-2 RNA is generally detectable in upper respiratory specimens during the acute phase of infection. The lowest concentration of SARS-CoV-2 viral copies this assay can detect is 138 copies/mL. A negative result does not preclude SARS-Cov-2 infection and should not be used as the sole basis for treatment or other patient management decisions. A negative result may occur with  improper specimen collection/handling, submission of specimen other than nasopharyngeal swab, presence of viral mutation(s) within the areas targeted by this assay, and inadequate number of viral copies(<138 copies/mL). A negative result must be combined with clinical observations, patient history, and epidemiological information. The expected result is Negative.  Fact Sheet for Patients:  BloggerCourse.com  Fact Sheet for Healthcare Providers:  SeriousBroker.it  This test is no t yet approved or cleared by the [place] FDA and  has been authorized for detection and/or diagnosis of SARS-CoV-2 by FDA under an Emergency Use Authorization (EUA). This EUA will remain  in effect (meaning this test can be used) for the duration of the COVID-19 declaration under Section 564(b)(1) of the Act, 21 U.S.C.section  360bbb-3(b)(1), unless the authorization is terminated  or revoked sooner.       Influenza A by PCR NEGATIVE NEGATIVE Final   Influenza B by PCR NEGATIVE NEGATIVE Final    Comment: (NOTE) The Xpert Xpress SARS-CoV-2/FLU/RSV plus assay is intended as an aid in the diagnosis of influenza from Nasopharyngeal swab specimens and should not be used as a sole basis for treatment. Nasal washings and aspirates are unacceptable for Xpert Xpress SARS-CoV-2/FLU/RSV testing.  Fact Sheet for Patients: BloggerCourse.com  Fact Sheet for Healthcare Providers: SeriousBroker.it  This test is not yet approved or cleared by the [place] FDA and has been authorized for detection and/or diagnosis of SARS-CoV-2 by FDA under an Emergency Use Authorization (EUA). This EUA will remain in effect (meaning this test can be used)  for the duration of the COVID-19 declaration under Section 564(b)(1) of the Act, 21 U.S.C. section 360bbb-3(b)(1), unless the authorization is terminated or revoked.  Performed at [facility], [address] [place], [place] [address]   Urine culture     Status: None   Collection Time: 11/21/20  6:24 PM   Specimen: In/Out Cath Urine  Result Value Ref Range Status   Specimen Description   Final    IN/OUT CATH URINE Performed at [facility], [address] [place], [place] [address]    Special Requests   Final    NONE Performed at [facility], [address] [place], [place] [address]    Culture   Final    NO GROWTH Performed at [facility], [address] [place], [place] [address]    Report Status 11/23/2020 FINAL  Final  Blood Culture (routine x 2)     Status: None   Collection Time: 11/21/20  6:30 PM   Specimen: BLOOD  Result Value Ref Range Status   Specimen Description   Final    BLOOD RIGHT ANTECUBITAL Performed at [facility], [address] [place], [place] [address]    Special Requests   Final    BOTTLES DRAWN AEROBIC AND ANAEROBIC Blood Culture adequate volume Performed at [facility], [address], [place], [place] [address]    Culture   Final    NO GROWTH 5 DAYS Performed at [facility], [address], [place], [place] [address]    Report Status 11/26/2020 FINAL  Final  Blood Culture (routine x 2)     Status: None   Collection Time: 11/21/20  6:34 PM   Specimen: BLOOD RIGHT HAND  Result Value Ref Range Status   Specimen Description   Final    BLOOD RIGHT HAND Performed at [facility], [address], [place], [place] [address]    Special Requests   Final    BOTTLES DRAWN AEROBIC AND ANAEROBIC Blood Culture adequate volume Performed at [facility], [address] [place], [place] [address]    Culture   Final    NO GROWTH 5 DAYS Performed at [facility], [address], [place], [place] [address]    Report Status 11/26/2020 FINAL  Final  SARS CORONAVIRUS 2 (TAT 6-24 HRS) Nasopharyngeal Nasopharyngeal Swab     Status: None   Collection Time: 11/27/20  2:47 PM   Specimen: Nasopharyngeal Swab  Result Value Ref Range Status   SARS Coronavirus 2 NEGATIVE NEGATIVE Final    Comment: (NOTE) SARS-CoV-2 target nucleic acids are NOT DETECTED.  The SARS-CoV-2 RNA is generally detectable in upper and lower respiratory specimens during the acute phase of infection. Negative results do not preclude SARS-CoV-2 infection, do not rule out co-infections with other pathogens, and should not be used as the sole basis for treatment or other patient management decisions. Negative results must be combined with clinical observations, patient history, and epidemiological information. The expected result is Negative.  Fact Sheet for Patients: HairSlick.no  Fact Sheet for Healthcare  Providers: quierodirigir.com  This test is not yet approved or cleared by the [place] FDA and  has been authorized for detection and/or diagnosis of SARS-CoV-2 by FDA under an Emergency Use Authorization (EUA). This EUA will remain  in effect (meaning this test can be used) for the duration of the COVID-19 declaration under Se ction 564(b)(1)  of the Act, 21 U.S.C. section 360bbb-3(b)(1), unless the authorization is terminated or revoked sooner.  Performed at [facility], [address] [place], [place] [address]          Radiology Studies: No results found.      Scheduled Meds:  carbidopa-levodopa  1 tablet Oral QHS   carbidopa-levodopa  1 tablet Oral Q24H   carbidopa-levodopa  2 tablet Oral 3 times per day   cephALEXin  500 mg Oral Q8H   Chlorhexidine Gluconate Cloth  6 each Topical Daily   feeding supplement  237 mL Oral BID BM   finasteride  5 mg Oral Daily   insulin aspart  0-15 Units Subcutaneous TID WC   insulin aspart  0-5 Units Subcutaneous QHS   memantine  5 mg Oral BID   midodrine  10 mg Oral Daily   rivastigmine  9.5 mg Transdermal Daily   Continuous Infusions:  Assessment & Plan:   Principal Problem:   Acute metabolic encephalopathy Active Problems:   Hyperlipidemia   Obstructive sleep apnea   Parkinson's disease with neurogenic orthostatic hypotension   Type 2 diabetes mellitus without complication, without long-term current use of insulin   Severe sepsis (HCC)   Acute lower UTI   Lactic acidosis   Generalized weakness   Acute metabolic encephalopathy history of Parkinson's dementia MS improved. Around baseline Continue to monitor Continue reorientation if need to.  Urinary tract infection in the setting of chronic indwelling Foley catheter with severe sepsis Lactic acid level level was noted to be elevated and has become normal.  There could be an element of colonization as well. Patient initially  started on ceftriaxone.  Cultures without any growth so far.   Patient was transitioned to Keflex. Catheter [facility] [place] [facility] [place].   Discussed with the inpatient urology and they went ahead and change his catheter on 6/11.  6/15 to follow-up with his primary urologist    Generalized weakness Likely in the setting of acute infection.  No obvious focal neurological deficits noted.  PT and OT evaluation.  6/15 SNF is recommended for rehab   History of Parkinson's disease/Parkinson's dementia Continue on carbidopa/levodopa.  Also on rivastigmine.     Diabetes mellitus type 2 without complication On metformin at home.  Monitor CBGs.  May resume metformin at discharge.  HbA1c 7.5.  History of obstructive sleep apnea CPAP at nighttime.  Hyperlipidemia On atorvastatin at home.    DVT prophylaxis: SCD Code Status: Full Family Communication: None at bedside Disposition Plan:  Status is: Inpatient  Remains inpatient appropriate because:Inpatient level of care appropriate due to severity of illness  Dispo: The patient is from: Home              Anticipated d/c is to: SNF              Patient currently is not medically stable to d/c.   Difficult to place patient No   Needs to be without mittens for 24 hrs to go to snf. NSG made aware.         LOS: 7 days   Time spent: 35 min with more than 50% on COC    Lynn ItoSahar Suzzane Quilter, MD [facility] Pager [phone]  If 7PM-7AM, please contact night-coverage 11/28/2020, 2:23 PM

## 2020-11-28 NOTE — Progress Notes (Signed)
Bilateral Hand Mittens removed with morning assessment. Pt is alert this AM and oriented to name. Will check Pt frequently to prevent pulling on foley catheter.Maintain all safety precautions and current plan of care.

## 2020-11-28 NOTE — Progress Notes (Addendum)
Seen pt holding and pulling again the [facility], told not to pull it, pt claimed that he did not know he is pulling it and he do not want to do it, pt agreed to put on mittens on both hand for safety- mittens applied, pt Bipap mask was also removed by pt and asked to put back but pt refused Noted minimal bleeding seen in urethral opening and scrotal area

## 2020-11-28 NOTE — Progress Notes (Signed)
Nutrition Follow-up  DOCUMENTATION CODES:   Not applicable  INTERVENTION:  - continue Ensure Enlive BID and Magic Cup with lunch meals.  - will d/c multivitamin as patient is eating well and accepting Ensure when offered.    NUTRITION DIAGNOSIS:   Increased nutrient needs related to acute illness (severe sepsis on admission d/t UTI) as evidenced by estimated needs. -ongoing  GOAL:   Patient will meet greater than or equal to 90% of their needs -met  MONITOR:   PO intake, Supplement acceptance, Labs, Weight trends   ASSESSMENT:   75 y.o. male with medical history of Parkinson's disease, chronic indwelling foley catheter d/t BPH, type 2 DM, OSA on nightly CPAP, and HLD. He presented to the ED on 6/8 with AMS/acute metabolic encephalopathy 2/2 severe sepsis d/t UTI.  Most recently documented meal completions were 100% of all meals on 6/13 (total of 2304 kcal and 99 grams protein); 100% of all meals on 6/14 (total of 2203 kcal and 98 grams protein); 90% of breakfast this AM (733 kcal and 24 grams protein).  He has been accepting Ensure 100% of the time offered.   Weight has been mainly stable throughout hospitalization. No information documented in the edema section of flow sheet.   He was last seen by SLP this AM who recommends continuing current diet order: dysphagia 3, thin liquids.   Discharge order and summary in from yesterday. Patient pending insurance authorization in order to d/c to [facility].      Labs reviewed; CBGs: 139 and 158 mg/dl,. Medications reviewed; sliding scale novolog, 1 tablet multivitamin with minerals/day.    Diet Order:   Diet Order             DIET DYS 3 Room service appropriate? Yes; Fluid consistency: Thin  Diet effective now                   EDUCATION NEEDS:   Not appropriate for education at this time  Skin:  Skin Assessment: Reviewed RN Assessment  Last BM:  6/13 (tyep 6)  Height:   Ht Readings from Last 1  Encounters:  11/21/20 5' 7"  (1.702 m)    Weight:   Wt Readings from Last 1 Encounters:  11/28/20 75.1 kg      Estimated Nutritional Needs:  Kcal:  1900-2100 kcal Protein:  90-105 grams Fluid:  >/= 2.5 L/day     Jarome Matin, MS, RD, LDN, CNSC Inpatient Clinical Dietitian RD pager # available in [facility]  After hours/weekend pager # available in [facility]

## 2020-11-29 LAB — METHYLMALONIC ACID, SERUM: Methylmalonic Acid, Quantitative: 115 nmol/L (ref 0–378)

## 2020-11-29 LAB — GLUCOSE, CAPILLARY
Glucose-Capillary: 134 mg/dL — ABNORMAL HIGH (ref 70–99)
Glucose-Capillary: 191 mg/dL — ABNORMAL HIGH (ref 70–99)

## 2020-11-29 MED ORDER — CEPHALEXIN 500 MG PO CAPS: 500.0000 mg | ORAL_CAPSULE | Freq: Three times a day (TID) | ORAL | 0 refills | Status: AC

## 2020-11-29 NOTE — Progress Notes (Addendum)
Report given to Orthoptist at [facility]. All questions answered. Awaiting PTAR for transport to SNF facility.   Pt belongings (clothes) placed in bag at bedside to go to facility with pt.

## 2020-11-29 NOTE — TOC Transition Note (Signed)
Transition of Care [facility]) - CM/SW Discharge Note   Patient Details  Name: Ryan Morales MRN: [id number] Date of Birth: 1945-08-22  [facility] CM/SW Contact:  Lanier Clam, RN Phone Number: 11/29/2020, 1:21 PM   Clinical Narrative: d/c today going to [facility] accepting.d/c summary sent. PTAR to be called once rm# provided. MD/Nsg updated.      Final next level of care: Skilled Nursing Facility Barriers to Discharge: No Barriers Identified   Patient Goals and CMS Choice Patient states their goals for this hospitalization and ongoing recovery are:: go to rehab CMS Medicare.gov Compare Post Acute Care list provided to:: Patient Represenative (must comment) Elnita Maxwell spouse [phone]) Choice offered to / list presented to : Spouse  Discharge Placement PASRR number recieved: 11/27/20            Patient chooses bed at: [facility] Patient to be transferred to facility by: PTAR Name of family member notified: Elnita Maxwell spouse [phone] Patient and family notified of of transfer: 11/29/20  Discharge Plan and Services   Discharge Planning Services: CM Consult Post Acute Care Choice: Skilled Nursing Facility                               Social Determinants of Health (SDOH) Interventions     Readmission Risk Interventions No flowsheet data found.

## 2021-01-18 ENCOUNTER — Other Ambulatory Visit: Payer: Self-pay

## 2021-01-18 MED ORDER — ESCITALOPRAM OXALATE 10 MG PO TABS: 10.0000 mg | ORAL_TABLET | Freq: Every day | ORAL | 0 refills | Status: DC

## 2021-02-12 ENCOUNTER — Other Ambulatory Visit: Payer: Self-pay | Admitting: Neurology

## 2021-03-05 ENCOUNTER — Other Ambulatory Visit: Payer: Self-pay | Admitting: Neurology

## 2021-03-05 DIAGNOSIS — G20 Parkinson's disease: Secondary | ICD-10-CM

## 2021-03-16 DEATH — deceased

## 2021-04-18 ENCOUNTER — Ambulatory Visit: Payer: Medicare (Managed Care) | Admitting: Neurology

## 2022-06-05 IMAGING — DX DG CHEST 1V PORT
1 series · 2 of 2 positions shown · non-contrast
Comparison: None.

CLINICAL DATA: Weakness

EXAM:
PORTABLE CHEST 1 VIEW

[Series 1: chest ap · 0.14mm/px · 2 of 2 slices shown]
[im 1/2]
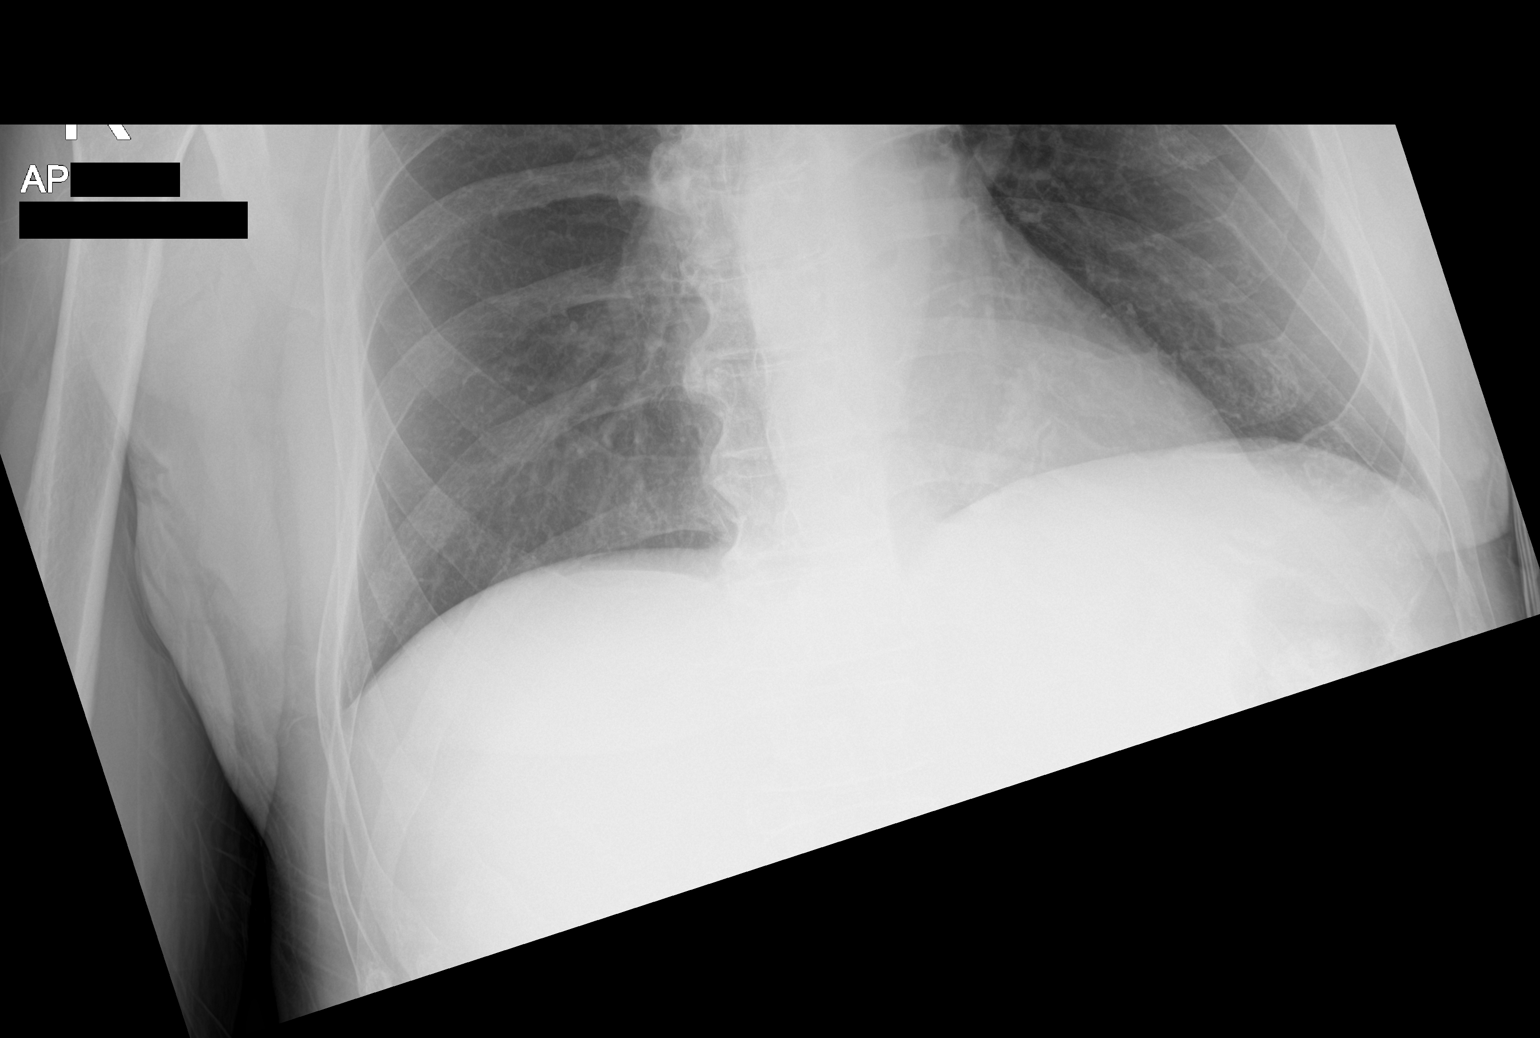
[im 2/2]
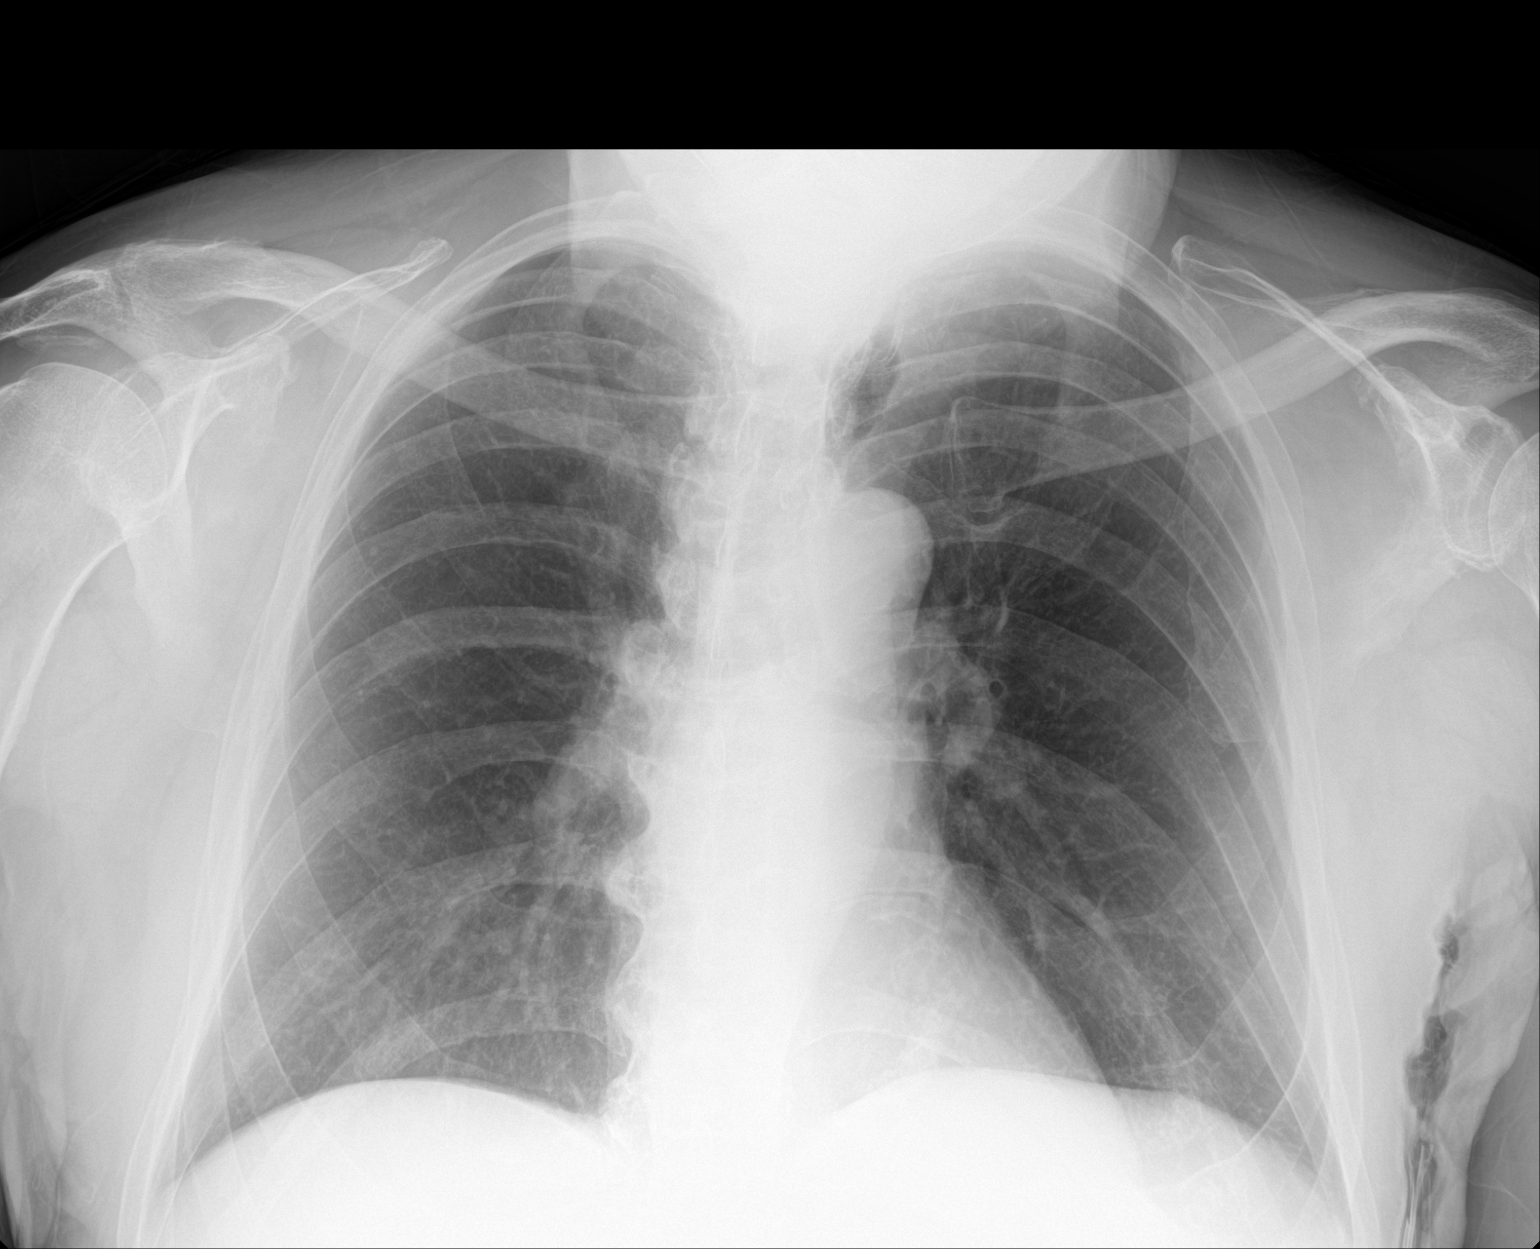

[2 of 2 positions shown; findings below may reference images not displayed]

FINDINGS: The heart size and mediastinal contours are within normal limits. No
focal airspace disease. No pleural effusion or pneumothorax. No
acute osseous abnormality.
IMPRESSION: No evidence of acute cardiopulmonary disease.
